# Patient Record
Sex: Female | Born: 1990 | Race: Black or African American | Hispanic: No | Marital: Single | Smoking: Former smoker
Health system: Southern US, Community
[De-identification: ages and names within clinical notes are randomized; demographics above are authoritative.]

## PROBLEM LIST (undated history)

## (undated) ENCOUNTER — Inpatient Hospital Stay (HOSPITAL_COMMUNITY): Payer: Self-pay

## (undated) ENCOUNTER — Emergency Department (HOSPITAL_COMMUNITY): Disposition: A | Discharge status: Home / Self Care | Payer: Self-pay

## (undated) DIAGNOSIS — F32A Depression, unspecified: Secondary | ICD-10-CM

## (undated) DIAGNOSIS — F329 Major depressive disorder, single episode, unspecified: Secondary | ICD-10-CM

## (undated) DIAGNOSIS — F99 Mental disorder, not otherwise specified: Secondary | ICD-10-CM

## (undated) DIAGNOSIS — F419 Anxiety disorder, unspecified: Secondary | ICD-10-CM

## (undated) DIAGNOSIS — O139 Gestational [pregnancy-induced] hypertension without significant proteinuria, unspecified trimester: Secondary | ICD-10-CM

## (undated) HISTORY — PX: MOUTH SURGERY: SHX715

## (undated) HISTORY — PX: TOOTH EXTRACTION: SUR596

## (undated) HISTORY — DX: Gestational (pregnancy-induced) hypertension without significant proteinuria, unspecified trimester: O13.9

---
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
## 2009-06-10 ENCOUNTER — Other Ambulatory Visit: Admission: RE | Admit: 2009-06-10 | Discharge: 2009-06-10 | Payer: Self-pay | Admitting: Obstetrics and Gynecology

## 2010-08-18 ENCOUNTER — Other Ambulatory Visit (HOSPITAL_COMMUNITY)
Admission: RE | Admit: 2010-08-18 | Discharge: 2010-08-18 | Disposition: A | Discharge status: Home / Self Care | Payer: Managed Care, Other (non HMO) | Source: Ambulatory Visit | Attending: Obstetrics and Gynecology | Admitting: Obstetrics and Gynecology

## 2010-08-18 ENCOUNTER — Other Ambulatory Visit: Payer: Self-pay | Admitting: Nurse Practitioner

## 2010-08-18 DIAGNOSIS — Z01419 Encounter for gynecological examination (general) (routine) without abnormal findings: Secondary | ICD-10-CM | POA: Insufficient documentation

## 2010-08-18 DIAGNOSIS — Z113 Encounter for screening for infections with a predominantly sexual mode of transmission: Secondary | ICD-10-CM | POA: Insufficient documentation

## 2011-05-10 ENCOUNTER — Encounter (HOSPITAL_COMMUNITY): Payer: Self-pay | Admitting: Emergency Medicine

## 2011-05-10 ENCOUNTER — Emergency Department (INDEPENDENT_AMBULATORY_CARE_PROVIDER_SITE_OTHER)
Admission: EM | Admit: 2011-05-10 | Discharge: 2011-05-10 | Disposition: A | Discharge status: Home / Self Care | Payer: Managed Care, Other (non HMO) | Source: Home / Self Care | Attending: Family Medicine | Admitting: Family Medicine

## 2011-05-10 ENCOUNTER — Emergency Department (INDEPENDENT_AMBULATORY_CARE_PROVIDER_SITE_OTHER): Payer: Managed Care, Other (non HMO)

## 2011-05-10 DIAGNOSIS — S239XXA Sprain of unspecified parts of thorax, initial encounter: Secondary | ICD-10-CM

## 2011-05-10 DIAGNOSIS — Z041 Encounter for examination and observation following transport accident: Secondary | ICD-10-CM

## 2011-05-10 DIAGNOSIS — S29012A Strain of muscle and tendon of back wall of thorax, initial encounter: Secondary | ICD-10-CM

## 2011-05-10 MED ORDER — DICLOFENAC POTASSIUM 50 MG PO TABS: 50.0000 mg | ORAL_TABLET | Freq: Three times a day (TID) | ORAL | Status: AC
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
## 2011-05-10 NOTE — ED Notes (Signed)
REPORT GIVEN TO AMY RN.

## 2011-05-10 NOTE — Discharge Instructions (Signed)
Use medication for back soreness as needed, heat to back for comfort, regular activity. Return if needed but expect soreness for 7-10 days.
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
## 2011-09-28 ENCOUNTER — Encounter: Payer: Managed Care, Other (non HMO) | Admitting: Obstetrics & Gynecology

## 2012-10-30 ENCOUNTER — Encounter (HOSPITAL_COMMUNITY): Payer: Self-pay

## 2012-10-30 ENCOUNTER — Inpatient Hospital Stay (HOSPITAL_COMMUNITY)
Admission: AD | Admit: 2012-10-30 | Discharge: 2012-10-30 | Disposition: A | Discharge status: Home / Self Care | Payer: Managed Care, Other (non HMO) | Source: Ambulatory Visit | Attending: Obstetrics & Gynecology | Admitting: Obstetrics & Gynecology

## 2012-10-30 ENCOUNTER — Inpatient Hospital Stay (HOSPITAL_COMMUNITY): Payer: Managed Care, Other (non HMO)

## 2012-10-30 DIAGNOSIS — O2390 Unspecified genitourinary tract infection in pregnancy, unspecified trimester: Secondary | ICD-10-CM | POA: Insufficient documentation

## 2012-10-30 DIAGNOSIS — O4690 Antepartum hemorrhage, unspecified, unspecified trimester: Secondary | ICD-10-CM

## 2012-10-30 DIAGNOSIS — N760 Acute vaginitis: Secondary | ICD-10-CM | POA: Insufficient documentation

## 2012-10-30 DIAGNOSIS — A499 Bacterial infection, unspecified: Secondary | ICD-10-CM | POA: Insufficient documentation

## 2012-10-30 DIAGNOSIS — O209 Hemorrhage in early pregnancy, unspecified: Secondary | ICD-10-CM

## 2012-10-30 DIAGNOSIS — B9689 Other specified bacterial agents as the cause of diseases classified elsewhere: Secondary | ICD-10-CM | POA: Insufficient documentation

## 2012-10-30 DIAGNOSIS — R109 Unspecified abdominal pain: Secondary | ICD-10-CM | POA: Insufficient documentation

## 2012-10-30 DIAGNOSIS — O210 Mild hyperemesis gravidarum: Secondary | ICD-10-CM | POA: Insufficient documentation

## 2012-10-30 LAB — URINALYSIS, ROUTINE W REFLEX MICROSCOPIC
Bilirubin Urine: NEGATIVE
Glucose, UA: NEGATIVE mg/dL
Hgb urine dipstick: NEGATIVE
Ketones, ur: 15 mg/dL — AB
Leukocytes, UA: NEGATIVE
Nitrite: NEGATIVE
Protein, ur: NEGATIVE mg/dL
Specific Gravity, Urine: 1.025 (ref 1.005–1.030)
Urobilinogen, UA: 1 mg/dL (ref 0.0–1.0)
pH: 6 (ref 5.0–8.0)

## 2012-10-30 LAB — CBC
HCT: 37.6 % (ref 36.0–46.0)
Hemoglobin: 13.1 g/dL (ref 12.0–15.0)
MCH: 29.4 pg (ref 26.0–34.0)
MCHC: 34.8 g/dL (ref 30.0–36.0)
MCV: 84.3 fL (ref 78.0–100.0)
Platelets: 236 10*3/uL (ref 150–400)
RBC: 4.46 MIL/uL (ref 3.87–5.11)
RDW: 12.3 % (ref 11.5–15.5)
WBC: 8.7 10*3/uL (ref 4.0–10.5)

## 2012-10-30 LAB — WET PREP, GENITAL
Trich, Wet Prep: NONE SEEN
Yeast Wet Prep HPF POC: NONE SEEN

## 2012-10-30 LAB — ABO/RH: ABO/RH(D): O POS

## 2012-10-30 LAB — POCT PREGNANCY, URINE: Preg Test, Ur: POSITIVE — AB

## 2012-10-30 LAB — HCG, QUANTITATIVE, PREGNANCY: hCG, Beta Chain, Quant, S: 38465 m[IU]/mL — ABNORMAL HIGH (ref <5)

## 2012-10-30 MED ORDER — METRONIDAZOLE 500 MG PO TABS: 500.0000 mg | ORAL_TABLET | Freq: Two times a day (BID) | ORAL | Status: DC

## 2012-10-30 MED ORDER — ONDANSETRON HCL 4 MG PO TABS: 4.0000 mg | ORAL_TABLET | Freq: Four times a day (QID) | ORAL | Status: DC

## 2012-10-30 MED ORDER — PROMETHAZINE HCL 25 MG PO TABS: 25.0000 mg | ORAL_TABLET | Freq: Four times a day (QID) | ORAL | Status: DC | PRN

## 2012-10-30 NOTE — MAU Note (Signed)
Patient states she has had 2 positive home pregnancy tests. Has had cramping all day, nausea but no vomiting and light pink spotting on tissue with wiping x 4 today.

## 2012-10-30 NOTE — MAU Note (Signed)
Pt states she took 2 home pregnancy tests that were positive. Pt is having some spotting. Pt states she is seeing blood when she wipes and is having some discharge

## 2012-10-30 NOTE — MAU Provider Note (Signed)
History     CSN: [id number]  Arrival date and time: 10/30/12 3086   First Provider Initiated Contact with Patient 10/30/12 1844      Chief Complaint  Patient presents with  . Possible Pregnancy  . Vaginal Discharge  . Nausea  . Abdominal Pain   HPI [name] is a 22 y.o. G1P0 at [redacted]w[redacted]d who presents to MAU today with complaint of lower abdominal pain and vaginal spotting. The patient states LMP 09/16/12. She started spotting yesterday, she is also having a thick, white-pink discharge without odor. She has had nausea without vomiting, diarrhea or constipation. She denies fever or UTI symptoms.    OB History   Grav Para Term Preterm Abortions TAB SAB Ect Mult Living   1               History reviewed. No pertinent past medical history.  Past Surgical History  Procedure Laterality Date  . Mouth surgery      Family History  Problem Relation Age of Onset  . Hypertension Mother     History  Substance Use Topics  . Smoking status: Current Some Day Smoker    Types: Cigarettes  . Smokeless tobacco: Never Used  . Alcohol Use: 7.2 oz/week    2 Cans of beer, 10 Shots of liquor per week    Allergies:  Allergies  Allergen Reactions  . Dust Mite Extract   . Pollen Extract     No prescriptions prior to admission    Review of Systems  Constitutional: Negative for fever and malaise/fatigue.  Gastrointestinal: Positive for nausea and abdominal pain. Negative for vomiting, diarrhea and constipation.  Genitourinary: Negative for dysuria, urgency and frequency.       + vaginal bleeding, discharge  Neurological: Positive for dizziness. Negative for loss of consciousness and weakness.   Physical Exam   Blood pressure 124/75, pulse 84, temperature 98.2 F (36.8 C), temperature source Oral, resp. rate 16, height 5\' 4"  (1.626 m), weight 162 lb (73.483 kg), last menstrual period 09/16/2012, SpO2 100.00%.  Physical Exam  Constitutional: She is oriented to person, place,  and time. She appears well-developed and well-nourished. No distress.  HENT:  Head: Normocephalic and atraumatic.  Cardiovascular: Normal rate.   Respiratory: Effort normal.  GI: Soft. She exhibits no distension and no mass. There is no tenderness. There is no rebound and no guarding.  Genitourinary: Uterus is enlarged (appropriate for GA) and tender (mild tenderness to palpation on bimanual exam). Cervix exhibits no motion tenderness, no discharge and no friability. Right adnexum displays no mass and no tenderness. Left adnexum displays no mass and no tenderness. There is bleeding (scant bleeding) around the vagina. Vaginal discharge (small amount of thin, pink discharge noted) found.  Neurological: She is alert and oriented to person, place, and time.  Skin: Skin is warm and dry. No erythema.  Psychiatric: She has a normal mood and affect.   Results for orders placed during the hospital encounter of 10/30/12 (from the past 24 hour(s))  URINALYSIS, ROUTINE W REFLEX MICROSCOPIC     Status: Abnormal   Collection Time    10/30/12  6:25 PM      Result Value [place]   Color, Urine YELLOW  YELLOW   APPearance CLEAR  CLEAR   Specific Gravity, Urine 1.025  1.005 - 1.030   pH 6.0  5.0 - 8.0   Glucose, UA NEGATIVE  NEGATIVE mg/dL   Hgb urine dipstick NEGATIVE  NEGATIVE   Bilirubin Urine NEGATIVE  NEGATIVE   Ketones, ur 15 (*) NEGATIVE mg/dL   Protein, ur NEGATIVE  NEGATIVE mg/dL   Urobilinogen, UA 1.0  0.0 - 1.0 mg/dL   Nitrite NEGATIVE  NEGATIVE   Leukocytes, UA NEGATIVE  NEGATIVE  POCT PREGNANCY, URINE     Status: Abnormal   Collection Time    10/30/12  6:33 PM      Result Value [place]   Preg Test, Ur POSITIVE (*) NEGATIVE  WET PREP, GENITAL     Status: Abnormal   Collection Time    10/30/12  6:58 PM      Result Value [place]   Yeast Wet Prep HPF POC NONE SEEN  NONE SEEN   Trich, Wet Prep NONE SEEN  NONE SEEN   Clue Cells Wet Prep HPF POC FEW (*) NONE SEEN   WBC, Wet Prep HPF POC FEW (*)  NONE SEEN  CBC     Status: None   Collection Time    10/30/12  7:05 PM      Result Value [place]   WBC 8.7  4.0 - 10.5 K/uL   RBC 4.46  3.87 - 5.11 MIL/uL   Hemoglobin 13.1  12.0 - 15.0 g/dL   HCT 81.1  91.4 - 78.2 %   MCV 84.3  78.0 - 100.0 fL   MCH 29.4  26.0 - 34.0 pg   MCHC 34.8  30.0 - 36.0 g/dL   RDW 95.6  21.3 - 08.6 %   Platelets 236  150 - 400 K/uL  ABO/RH     Status: None   Collection Time    10/30/12  7:05 PM      Result Value [place]   ABO/RH(D) O POS     US Ob Comp Less 14 Wks  10/30/2012   CLINICAL DATA:  Spotting, cramping.  EXAM: OBSTETRIC <14 WK ULTRASOUND  TECHNIQUE: Transabdominal ultrasound was performed for evaluation of the gestation as well as the maternal uterus and adnexal regions.  COMPARISON:  None.  FINDINGS: Intrauterine gestational sac: Visualized/normal in shape.  Yolk sac:  Present  Embryo:  Present  Cardiac Activity: Visualized.  Heart Rate: 121 bpm  MSD:   mm    w     d  CRL:   5.3  mm   6 w 3 d                  [place] EDC: 06/22/2013  Maternal uterus/adnexae: No subchorionic hemorrhage. Right ovary unremarkable. Left ovary not visualized. No adnexal masses. No free fluid.  IMPRESSION: 6 week 3 day intrauterine pregnancy with fetal heart rate 121 beats per min. No visible subchorionic hemorrhage.   Electronically Signed   By: Charlett Nose M.D.   On: 10/30/2012 19:29    MAU Course  Procedures None  MDM + UPT  UA, Wet prep, GC/Chlamydia, CBC, ABO/Rh, quant hCG and [place] today  Assessment and Plan  A: IUP at 6w 3d with cardiac activity Bacterial vaginosis Nausea in early pregnancy  P: Discharge home Rx for Flagyl, phenergan and Zofran sent to patient's pharmacy GC/Chlamydia pending Patient given pregnancy confirmation letter. Medicaid assistance information given Patient referred to [facility] for prenatal care Patient may return to MAU as needed or if her condition were to change or worsen  Freddi Starr, PA-C 10/30/2012, 7:50 PM

## 2012-10-31 LAB — GC/CHLAMYDIA PROBE AMP
CT Probe RNA: NEGATIVE
GC Probe RNA: NEGATIVE

## 2012-12-04 ENCOUNTER — Encounter: Payer: Managed Care, Other (non HMO) | Admitting: Family Medicine

## 2013-09-04 ENCOUNTER — Encounter (HOSPITAL_COMMUNITY): Payer: Self-pay | Admitting: *Deleted

## 2013-12-11 ENCOUNTER — Other Ambulatory Visit: Payer: Self-pay | Admitting: Endodontics

## 2013-12-22 ENCOUNTER — Encounter (HOSPITAL_COMMUNITY): Payer: Self-pay | Admitting: *Deleted

## 2014-07-17 ENCOUNTER — Inpatient Hospital Stay (HOSPITAL_COMMUNITY): Payer: Managed Care, Other (non HMO)

## 2014-07-17 ENCOUNTER — Encounter (HOSPITAL_COMMUNITY): Payer: Self-pay | Admitting: *Deleted

## 2014-07-17 ENCOUNTER — Inpatient Hospital Stay (HOSPITAL_COMMUNITY)
Admission: EM | Admit: 2014-07-17 | Discharge: 2014-07-17 | Disposition: A | Discharge status: Home / Self Care | Payer: Managed Care, Other (non HMO) | Source: Ambulatory Visit | Attending: Family Medicine | Admitting: Family Medicine

## 2014-07-17 DIAGNOSIS — A499 Bacterial infection, unspecified: Secondary | ICD-10-CM

## 2014-07-17 DIAGNOSIS — O99331 Smoking (tobacco) complicating pregnancy, first trimester: Secondary | ICD-10-CM | POA: Diagnosis not present

## 2014-07-17 DIAGNOSIS — Z3A01 Less than 8 weeks gestation of pregnancy: Secondary | ICD-10-CM | POA: Diagnosis not present

## 2014-07-17 DIAGNOSIS — O23591 Infection of other part of genital tract in pregnancy, first trimester: Secondary | ICD-10-CM | POA: Insufficient documentation

## 2014-07-17 DIAGNOSIS — R102 Pelvic and perineal pain: Secondary | ICD-10-CM | POA: Diagnosis present

## 2014-07-17 DIAGNOSIS — B9689 Other specified bacterial agents as the cause of diseases classified elsewhere: Secondary | ICD-10-CM | POA: Insufficient documentation

## 2014-07-17 DIAGNOSIS — N760 Acute vaginitis: Secondary | ICD-10-CM | POA: Insufficient documentation

## 2014-07-17 DIAGNOSIS — F17210 Nicotine dependence, cigarettes, uncomplicated: Secondary | ICD-10-CM | POA: Insufficient documentation

## 2014-07-17 DIAGNOSIS — O26899 Other specified pregnancy related conditions, unspecified trimester: Secondary | ICD-10-CM

## 2014-07-17 DIAGNOSIS — Z8249 Family history of ischemic heart disease and other diseases of the circulatory system: Secondary | ICD-10-CM | POA: Diagnosis not present

## 2014-07-17 DIAGNOSIS — R109 Unspecified abdominal pain: Secondary | ICD-10-CM

## 2014-07-17 LAB — URINALYSIS, ROUTINE W REFLEX MICROSCOPIC
Bilirubin Urine: NEGATIVE
Glucose, UA: NEGATIVE mg/dL
Hgb urine dipstick: NEGATIVE
Ketones, ur: NEGATIVE mg/dL
Leukocytes, UA: NEGATIVE
Nitrite: NEGATIVE
Protein, ur: NEGATIVE mg/dL
Specific Gravity, Urine: 1.015 (ref 1.005–1.030)
Urobilinogen, UA: 0.2 mg/dL (ref 0.0–1.0)
pH: 8.5 — ABNORMAL HIGH (ref 5.0–8.0)

## 2014-07-17 LAB — WET PREP, GENITAL
Trich, Wet Prep: NONE SEEN
Yeast Wet Prep HPF POC: NONE SEEN

## 2014-07-17 LAB — CBC
HCT: 41 % (ref 36.0–46.0)
Hemoglobin: 14.3 g/dL (ref 12.0–15.0)
MCH: 30 pg (ref 26.0–34.0)
MCHC: 34.9 g/dL (ref 30.0–36.0)
MCV: 86.1 fL (ref 78.0–100.0)
Platelets: 272 10*3/uL (ref 150–400)
RBC: 4.76 MIL/uL (ref 3.87–5.11)
RDW: 12.6 % (ref 11.5–15.5)
WBC: 5.9 10*3/uL (ref 4.0–10.5)

## 2014-07-17 LAB — POCT PREGNANCY, URINE: Preg Test, Ur: POSITIVE — AB

## 2014-07-17 LAB — HCG, QUANTITATIVE, PREGNANCY: hCG, Beta Chain, Quant, S: 10480 m[IU]/mL — ABNORMAL HIGH (ref <5)

## 2014-07-17 MED ORDER — METRONIDAZOLE 500 MG PO TABS: 500.0000 mg | ORAL_TABLET | Freq: Two times a day (BID) | ORAL | Status: DC

## 2014-07-17 NOTE — MAU Provider Note (Signed)
History     CSN: [id number]  Arrival date and time: 07/17/14 1445   First Provider Initiated Contact with Patient 07/17/14 1616      Chief Complaint  Patient presents with  . Pelvic Pain   HPI  [name] is a 24 y.o. G2P0010 at 6389w5d here with report of lower right sided pelvic pain that started two days ago.  Pain is described as sharp.  Pain has been associated with clear white discharge.  Denies vaginal bleeding or abnormal vaginal discharge.    History reviewed. No pertinent past medical history.  Past Surgical History  Procedure Laterality Date  . Mouth surgery      Family History  Problem Relation Age of Onset  . Hypertension Mother     History  Substance Use Topics  . Smoking status: Current Some Day Smoker    Types: Cigarettes  . Smokeless tobacco: Never Used  . Alcohol Use: 7.2 oz/week    2 Cans of beer, 10 Shots of liquor per week    Allergies:  Allergies  Allergen Reactions  . Dust Mite Extract   . Pollen Extract     Prescriptions prior to admission  Medication Sig Dispense Refill Last Dose  . ibuprofen (ADVIL,MOTRIN) 200 MG tablet Take 200 mg by mouth every 6 (six) hours as needed (tooth pain).   07/17/2014 at Unknown time  . Prenatal Vit-Fe Fumarate-FA (PRENATAL MULTIVITAMIN) TABS tablet Take 1 tablet by mouth daily at 12 noon.   07/17/2014 at Unknown time  . bismuth subsalicylate (PEPTO BISMOL) 262 MG chewable tablet Chew 524 mg by mouth as needed for indigestion.   Not Taking at Unknown time  . metroNIDAZOLE (FLAGYL) 500 MG tablet Take 1 tablet (500 mg total) by mouth 2 (two) times daily. (Patient not taking: Reported on 07/17/2014) 14 tablet 0   . ondansetron (ZOFRAN) 4 MG tablet Take 1 tablet (4 mg total) by mouth every 6 (six) hours. (Patient not taking: Reported on 07/17/2014) 12 tablet 0 Not Taking at Unknown time  . promethazine (PHENERGAN) 25 MG tablet Take 1 tablet (25 mg total) by mouth every 6 (six) hours as needed for nausea. (Patient  not taking: Reported on 07/17/2014) 30 tablet 0 Not Taking at Unknown time    Review of Systems  Constitutional: Negative for fever.  Gastrointestinal: Positive for nausea and abdominal pain. Negative for vomiting and diarrhea.  Genitourinary: Negative for dysuria, urgency and frequency.  All other systems reviewed and are negative.  Physical Exam   Blood pressure 131/85, pulse 77, temperature 98.5 F (36.9 C), temperature source Oral, resp. rate 18, last menstrual period 05/31/2014, unknown if currently breastfeeding.  Physical Exam  Constitutional: She is oriented to person, place, and time. She appears well-developed and well-nourished. No distress.  HENT:  Head: Normocephalic.  Neck: Normal [place] of motion. Neck supple.  Cardiovascular: Normal rate, regular rhythm and normal heart sounds.  Exam reveals no gallop and no friction rub.   No murmur heard. Respiratory: Effort normal and breath sounds normal. No respiratory distress.  GI: She exhibits no mass. There is no tenderness. There is no rebound, no guarding and no CVA tenderness.  Genitourinary: Uterus is enlarged. Cervix exhibits no motion tenderness and no discharge. Vaginal discharge (white, creamy) found.  Musculoskeletal: Normal [place] of motion.  Neurological: She is alert and oriented to person, place, and time.  Skin: Skin is warm and dry.  Psychiatric: She has a normal mood and affect.    MAU Course  Procedures  Results for orders placed or performed during the hospital encounter of 07/17/14 (from the past 24 hour(s))  Urinalysis, Routine w reflex microscopic (not at [facility])     Status: Abnormal   Collection Time: 07/17/14  3:15 PM  Result Value Ref [place]   Color, Urine YELLOW YELLOW   APPearance CLEAR CLEAR   Specific Gravity, Urine 1.015 1.005 - 1.030   pH 8.5 (H) 5.0 - 8.0   Glucose, UA NEGATIVE NEGATIVE mg/dL   Hgb urine dipstick NEGATIVE NEGATIVE   Bilirubin Urine NEGATIVE NEGATIVE   Ketones, ur NEGATIVE  NEGATIVE mg/dL   Protein, ur NEGATIVE NEGATIVE mg/dL   Urobilinogen, UA 0.2 0.0 - 1.0 mg/dL   Nitrite NEGATIVE NEGATIVE   Leukocytes, UA NEGATIVE NEGATIVE  Pregnancy, urine POC     Status: Abnormal   Collection Time: 07/17/14  3:26 PM  Result Value Ref [place]   Preg Test, Ur POSITIVE (A) NEGATIVE  CBC     Status: None   Collection Time: 07/17/14  4:51 PM  Result Value Ref [place]   WBC 5.9 4.0 - 10.5 K/uL   RBC 4.76 3.87 - 5.11 MIL/uL   Hemoglobin 14.3 12.0 - 15.0 g/dL   HCT 11.9 14.7 - 82.9 %   MCV 86.1 78.0 - 100.0 fL   MCH 30.0 26.0 - 34.0 pg   MCHC 34.9 30.0 - 36.0 g/dL   RDW 56.2 13.0 - 86.5 %   Platelets 272 150 - 400 K/uL   Ultrasound: IMPRESSION: Single intrauterine gestational sac noted, with a mean sac diameter of 6.6 mm, corresponding to a gestational age of [redacted] weeks 2 days. This does not match the gestational age of [redacted] weeks 5 days by LMP, though it remains too early to determine an estimated date of delivery. A yolk sac is seen. No embryo is yet visualized, within normal limits.  Assessment and Plan  [name] is a 24 y.o. G2P0010 at [redacted]w[redacted]d pregnancy Bacterial Vaginosis  Plan: Discharge to home RX Flagyl 500 mg BID x 7 days Outpatient ultrasound due to discrepancy in patient dates/patient concern Given list of prenatal care providers  Elenora Fender [facility] N 07/17/2014, 4:20 PM

## 2014-07-17 NOTE — MAU Note (Signed)
Pos HPT 2 days ago, pt wanted to be sure of results.  Pt C/O intermittent lower abd pain & pain around umbilicus, denies bleeding.

## 2014-07-18 LAB — HIV ANTIBODY (ROUTINE TESTING W REFLEX): HIV Screen 4th Generation wRfx: NONREACTIVE

## 2014-07-21 LAB — GC/CHLAMYDIA PROBE AMP (~~LOC~~) NOT AT ARMC
Chlamydia: NEGATIVE
Neisseria Gonorrhea: NEGATIVE

## 2014-07-23 ENCOUNTER — Inpatient Hospital Stay (HOSPITAL_COMMUNITY): Payer: Managed Care, Other (non HMO)

## 2014-07-23 ENCOUNTER — Inpatient Hospital Stay (HOSPITAL_COMMUNITY)
Admission: AD | Admit: 2014-07-23 | Discharge: 2014-07-23 | Disposition: A | Discharge status: Home / Self Care | Payer: Managed Care, Other (non HMO) | Source: Ambulatory Visit | Attending: Obstetrics & Gynecology | Admitting: Obstetrics & Gynecology

## 2014-07-23 ENCOUNTER — Encounter (HOSPITAL_COMMUNITY): Payer: Self-pay | Admitting: *Deleted

## 2014-07-23 DIAGNOSIS — O209 Hemorrhage in early pregnancy, unspecified: Secondary | ICD-10-CM

## 2014-07-23 DIAGNOSIS — O4691 Antepartum hemorrhage, unspecified, first trimester: Secondary | ICD-10-CM | POA: Diagnosis not present

## 2014-07-23 DIAGNOSIS — Z3A01 Less than 8 weeks gestation of pregnancy: Secondary | ICD-10-CM | POA: Insufficient documentation

## 2014-07-23 DIAGNOSIS — Z87891 Personal history of nicotine dependence: Secondary | ICD-10-CM | POA: Insufficient documentation

## 2014-07-23 LAB — HCG, QUANTITATIVE, PREGNANCY: hCG, Beta Chain, Quant, S: 30448 m[IU]/mL — ABNORMAL HIGH (ref <5)

## 2014-07-23 MED ORDER — METRONIDAZOLE 0.75 % VA GEL: 1.0000 | Freq: Every day | VAGINAL | Status: DC

## 2014-07-23 NOTE — MAU Provider Note (Signed)
Chief Complaint: Vaginal Bleeding   First Provider Initiated Contact with Patient 07/23/14 1235     SUBJECTIVE HPI: Carol Nichols is a 24 y.o. G2P0010 at [redacted]w[redacted]d by LMP who presents to Maternity Admissions reporting pink discharge this morning. Seen in maternity admissions 07/17/2014 abdominal pain. 5.[redacted] week Gestational sac with yolk sac seen. Diagnosed with BV. Currently undergoing treatment with PO Flagyl, and unable to keep down because it causes nausea and vomiting.  No pain today. Last intercourse 1 week ago. O+.  History reviewed. No pertinent past medical history. OB History  Gravida Para Term Preterm AB SAB TAB Ectopic Multiple Living  # Outcome Date GA Lbr Len/2nd Weight Sex Delivery Anes PTL Lv  2 Current           1 TAB              Comments: System Generated. Please review and update pregnancy details.     Past Surgical History  Procedure Laterality Date  . Mouth surgery     History   Social History  . Marital Status: Single    Spouse Name: N/A  . Number of Children: N/A  . Years of Education: N/A   Occupational History  . Not on file.   Social History Main Topics  . Smoking status: Former Smoker    Types: Cigarettes    Quit date: 07/15/2014  . Smokeless tobacco: Never Used  . Alcohol Use: No     Comment: 07/15/14 quit while pregnant  . Drug Use: No     Comment: 07/15/14- quit   . Sexual Activity: Yes    Birth Control/ Protection: None   Other Topics Concern  . Not on file   Social History Narrative   No current facility-administered medications on file prior to encounter.   Current Outpatient Prescriptions on File Prior to Encounter  Medication Sig Dispense Refill  . metroNIDAZOLE (FLAGYL) 500 MG tablet Take 1 tablet (500 mg total) by mouth 2 (two) times daily. 14 tablet 0  . Prenatal Vit-Fe Fumarate-FA (PRENATAL MULTIVITAMIN) TABS tablet Take 1 tablet by mouth daily at 12 noon.     Allergies  Allergen Reactions  . Dust Mite Extract    . Pollen Extract     Review of Systems  Constitutional: Negative for fever and chills.  Gastrointestinal: Negative for abdominal pain.  Genitourinary: Negative for hematuria.       Positive for vaginal bleeding, vaginal discharge. Negative for passage of clots or tissue.  Skin: Negative for itching.    OBJECTIVE Blood pressure 132/88, pulse 80, temperature 98.2 F (36.8 C), temperature source Oral, resp. rate 18, height 5' 4.17" (1.63 m), weight 159 lb 2 oz (72.179 kg), last menstrual period 05/31/2014. GENERAL: Well-developed, well-nourished female in no acute distress. Anxious. HEART: normal rate RESP: normal effort GI: Abdomen soft, non-tender.  MS: Nontender, no edema NEURO: Alert and oriented SPECULUM EXAM: Declined due to recent exam.  LAB RESULTS Results for orders placed or performed during the hospital encounter of 07/23/14 (from the past 24 hour(s))  hCG, quantitative, pregnancy     Status: Abnormal   Collection Time: 07/23/14 12:20 PM  Result Value Ref [place]   hCG, Beta Chain, Quant, S 30448 (H) <5 mIU/mL    IMAGING US Ob Comp Less 14 Wks  07/17/2014   CLINICAL DATA:  Subacute onset of right pelvic pain. Initial encounter.  EXAM: OBSTETRIC <14 WK [place] AND TRANSVAGINAL  OB US  TECHNIQUE: Both transabdominal and transvaginal ultrasound examinations were performed for complete evaluation of the gestation as well as the maternal uterus, adnexal regions, and pelvic cul-de-sac. Transvaginal technique was performed to assess early pregnancy.  COMPARISON:  Pelvic ultrasound performed 10/30/2012  FINDINGS: Intrauterine gestational sac: Visualized/normal in shape.  Yolk sac:  Yes  Embryo:  No  Cardiac Activity: N/A  MSD: 6.6  mm   5 w   2  d  Maternal uterus/adnexae: No subchorionic hemorrhage is noted. The uterus is otherwise unremarkable in appearance.  The ovaries are within normal limits. The right ovary measures 4.1 x 2.1 x 2.3 cm, while the left ovary measures 2.5 x 1.7 x 1.5  cm. No suspicious adnexal masses are seen; there is no evidence for ovarian torsion.  Trace free fluid is seen within the pelvic cul-de-sac.  IMPRESSION: Single intrauterine gestational sac noted, with a mean sac diameter of 6.6 mm, corresponding to a gestational age of [redacted] weeks 2 days. This does not match the gestational age of [redacted] weeks 5 days by LMP, though it remains too early to determine an estimated date of delivery. A yolk sac is seen. No embryo is yet visualized, within normal limits.   Electronically Signed   By: Roanna Raider M.D.   On: 07/17/2014 17:30   US Ob Transvaginal  07/23/2014   CLINICAL DATA:  Pregnant, bleeding/spotting  EXAM: TRANSVAGINAL OB ULTRASOUND  TECHNIQUE: Transvaginal ultrasound was performed for complete evaluation of the gestation as well as the maternal uterus, adnexal regions, and pelvic cul-de-sac.  COMPARISON:  07/17/2014  FINDINGS: Intrauterine gestational sac: Visualized/normal in shape.  Yolk sac:  Present  Embryo:  Not visualized  MSD: 12.1  mm   6 w   0  d  Maternal uterus/adnexae: No subchronic hemorrhage.  Right ovary is notable for a corpus luteal cyst.  Left ovary is within normal limits.  No free fluid.  IMPRESSION: Single intrauterine gestational sac with yolk sac, measuring 6 weeks 0 days by mean sac diameter.  No fetal pole is visualized.  Consider follow-up pelvic ultrasound in 14 days to confirm viability as clinically warranted.   Electronically Signed   By: Charline Bills M.D.   On: 07/23/2014 14:02   US Ob Transvaginal  07/17/2014   CLINICAL DATA:  Subacute onset of right pelvic pain. Initial encounter.  EXAM: OBSTETRIC <14 WK [place] AND TRANSVAGINAL OB US  TECHNIQUE: Both transabdominal and transvaginal ultrasound examinations were performed for complete evaluation of the gestation as well as the maternal uterus, adnexal regions, and pelvic cul-de-sac. Transvaginal technique was performed to assess early pregnancy.  COMPARISON:  Pelvic ultrasound performed  10/30/2012  FINDINGS: Intrauterine gestational sac: Visualized/normal in shape.  Yolk sac:  Yes  Embryo:  No  Cardiac Activity: N/A  MSD: 6.6  mm   5 w   2  d  Maternal uterus/adnexae: No subchorionic hemorrhage is noted. The uterus is otherwise unremarkable in appearance.  The ovaries are within normal limits. The right ovary measures 4.1 x 2.1 x 2.3 cm, while the left ovary measures 2.5 x 1.7 x 1.5 cm. No suspicious adnexal masses are seen; there is no evidence for ovarian torsion.  Trace free fluid is seen within the pelvic cul-de-sac.  IMPRESSION: Single intrauterine gestational sac noted, with a mean sac diameter of 6.6 mm, corresponding to a gestational age of [redacted] weeks 2 days. This does not match the gestational age of [redacted] weeks 5 days by LMP, though it  remains too early to determine an estimated date of delivery. A yolk sac is seen. No embryo is yet visualized, within normal limits.   Electronically Signed   By: Roanna RaiderJeffery  Chang M.D.   On: 07/17/2014 17:30    MAU COURSE Ultrasound, Quant.  Fetal pole still not seen, but still too soon to diagnose failed pregnancy. Abnormal rise in Quant. Explained to patient that findings are concerning for a failing pregnancy, but not yet diagnostic.  ASSESSMENT 1. Vaginal bleeding in pregnancy, first trimester    PLAN Discharge home in stable condition. SAB precautions.     Follow-up Information    [facility] 1 week.   Specialty:  Radiology   Why:  For follow-up ultrasound   Contact information:   [address] [id number] [place] [id number] [phone]      [facility] [place] [facility].   Why:  As needed in emergencies   Contact information:   [address] [id number] [place] [id number] [phone]       Medication List    STOP taking these medications        metroNIDAZOLE 500 MG tablet   Commonly known as:  FLAGYL      TAKE these medications        metroNIDAZOLE 0.75 % vaginal gel  Commonly known as:  METROGEL  Place 1 Applicatorful vaginally at bedtime. Apply one applicatorful to vagina at bedtime for 5 days     prenatal multivitamin Tabs tablet  Take 1 tablet by mouth daily at 12 noon.         [place] Nandi Tonnesen, CNM 07/23/2014  2:50 PM

## 2014-07-23 NOTE — MAU Note (Signed)
This morning had pink discharged. Taking med. for BV and prenatal victims. Just concerned about pink discharge. 2nd pregnancy, 1 abortion

## 2014-07-23 NOTE — Progress Notes (Signed)
Noticed pink, thick, non-odorous discharge this morning. Last had intercourse a week ago. Currently being treated for BV. Denies pain.

## 2014-07-23 NOTE — Discharge Instructions (Signed)
Vaginal Bleeding During Pregnancy, First Trimester  A small amount of bleeding (spotting) from the vagina is relatively common in early pregnancy. It usually stops on its own. Various things may cause bleeding or spotting in early pregnancy. Some bleeding may be related to the pregnancy, and some may not. In most cases, the bleeding is normal and is not a problem. However, bleeding can also be a sign of something serious. Be sure to tell your health care provider about any vaginal bleeding right away.  Some possible causes of vaginal bleeding during the first trimester include:  · Infection or inflammation of the cervix.  · Growths (polyps) on the cervix.  · Miscarriage or threatened miscarriage.  · Pregnancy tissue has developed outside of the uterus and in a fallopian tube (tubal pregnancy).  · Tiny cysts have developed in the uterus instead of pregnancy tissue (molar pregnancy).  HOME CARE INSTRUCTIONS   Watch your condition for any changes. The following actions may help to lessen any discomfort you are feeling:  · Follow your health care provider's instructions for limiting your activity. If your health care provider orders bed rest, you may need to stay in bed and only get up to use the bathroom. However, your health care provider may allow you to continue light activity.  · If needed, make plans for someone to help with your regular activities and responsibilities while you are on bed rest.  · Keep track of the number of pads you use each day, how often you change pads, and how soaked (saturated) they are. Write this down.  · Do not use tampons. Do not douche.  · Do not have sexual intercourse or orgasms until approved by your health care provider.  · If you pass any tissue from your vagina, save the tissue so you can show it to your health care provider.  · Only take over-the-counter or prescription medicines as directed by your health care provider.  · Do not take aspirin because it can make you  bleed.  · Keep all follow-up appointments as directed by your health care provider.  SEEK MEDICAL CARE IF:  · You have any vaginal bleeding during any part of your pregnancy.  · You have cramps or labor pains.  · You have a fever, not controlled by medicine.  SEEK IMMEDIATE MEDICAL CARE IF:   · You have severe cramps in your back or belly (abdomen).  · You pass large clots or tissue from your vagina.  · Your bleeding increases.  · You feel light-headed or weak, or you have fainting episodes.  · You have chills.  · You are leaking fluid or have a gush of fluid from your vagina.  · You pass out while having a bowel movement.  MAKE SURE YOU:  · Understand these instructions.  · Will watch your condition.  · Will get help right away if you are not doing well or get worse.  Document Released: 11/16/2004 Document Revised: 02/11/2013 Document Reviewed: 10/14/2012  ExitCare® Patient Information ©2015 [employer]. This information is not intended to replace advice given to you by your health care provider. Make sure you discuss any questions you have with your health care provider.

## 2014-07-23 NOTE — MAU Note (Signed)
Urine in lab 

## 2014-07-30 ENCOUNTER — Inpatient Hospital Stay (HOSPITAL_COMMUNITY)
Admission: AD | Admit: 2014-07-30 | Discharge: 2014-07-30 | Disposition: A | Discharge status: Home / Self Care | Payer: Managed Care, Other (non HMO) | Source: Ambulatory Visit | Attending: Obstetrics & Gynecology | Admitting: Obstetrics & Gynecology

## 2014-07-30 ENCOUNTER — Inpatient Hospital Stay (HOSPITAL_COMMUNITY): Payer: Managed Care, Other (non HMO)

## 2014-07-30 ENCOUNTER — Encounter (HOSPITAL_COMMUNITY): Payer: Self-pay | Admitting: *Deleted

## 2014-07-30 DIAGNOSIS — K5900 Constipation, unspecified: Secondary | ICD-10-CM | POA: Insufficient documentation

## 2014-07-30 DIAGNOSIS — R102 Pelvic and perineal pain: Secondary | ICD-10-CM | POA: Insufficient documentation

## 2014-07-30 DIAGNOSIS — O9989 Other specified diseases and conditions complicating pregnancy, childbirth and the puerperium: Secondary | ICD-10-CM | POA: Insufficient documentation

## 2014-07-30 DIAGNOSIS — Z3A01 Less than 8 weeks gestation of pregnancy: Secondary | ICD-10-CM | POA: Diagnosis not present

## 2014-07-30 DIAGNOSIS — O359XX1 Maternal care for (suspected) fetal abnormality and damage, unspecified, fetus 1: Secondary | ICD-10-CM

## 2014-07-30 DIAGNOSIS — R799 Abnormal finding of blood chemistry, unspecified: Secondary | ICD-10-CM

## 2014-07-30 DIAGNOSIS — Z87891 Personal history of nicotine dependence: Secondary | ICD-10-CM | POA: Insufficient documentation

## 2014-07-30 DIAGNOSIS — R1030 Lower abdominal pain, unspecified: Secondary | ICD-10-CM | POA: Diagnosis present

## 2014-07-30 DIAGNOSIS — O26891 Other specified pregnancy related conditions, first trimester: Secondary | ICD-10-CM

## 2014-07-30 DIAGNOSIS — O3680X1 Pregnancy with inconclusive fetal viability, fetus 1: Secondary | ICD-10-CM

## 2014-07-30 DIAGNOSIS — IMO0002 Reserved for concepts with insufficient information to code with codable children: Secondary | ICD-10-CM

## 2014-07-30 LAB — URINALYSIS, ROUTINE W REFLEX MICROSCOPIC
Bilirubin Urine: NEGATIVE
Glucose, UA: NEGATIVE mg/dL
Hgb urine dipstick: NEGATIVE
Ketones, ur: NEGATIVE mg/dL
Leukocytes, UA: NEGATIVE
Nitrite: NEGATIVE
Protein, ur: NEGATIVE mg/dL
Specific Gravity, Urine: 1.02 (ref 1.005–1.030)
Urobilinogen, UA: 0.2 mg/dL (ref 0.0–1.0)
pH: 7 (ref 5.0–8.0)

## 2014-07-30 LAB — CBC
HCT: 39.8 % (ref 36.0–46.0)
Hemoglobin: 13.9 g/dL (ref 12.0–15.0)
MCH: 30.3 pg (ref 26.0–34.0)
MCHC: 34.9 g/dL (ref 30.0–36.0)
MCV: 86.7 fL (ref 78.0–100.0)
Platelets: 245 10*3/uL (ref 150–400)
RBC: 4.59 MIL/uL (ref 3.87–5.11)
RDW: 12.7 % (ref 11.5–15.5)
WBC: 7 10*3/uL (ref 4.0–10.5)

## 2014-07-30 LAB — HCG, QUANTITATIVE, PREGNANCY: hCG, Beta Chain, Quant, S: 35674 m[IU]/mL — ABNORMAL HIGH (ref <5)

## 2014-07-30 MED ORDER — TRAMADOL HCL 50 MG PO TABS
100.0000 mg | ORAL_TABLET | Freq: Once | ORAL | Status: AC
  Administered 2014-07-30: 100 mg via ORAL
  Filled 2014-07-30: qty 2

## 2014-07-30 MED ORDER — HYDROCODONE-ACETAMINOPHEN 5-325 MG PO TABS: 1.0000 | ORAL_TABLET | ORAL | Status: DC | PRN

## 2014-07-30 NOTE — Discharge Instructions (Signed)
First Trimester of Pregnancy The first trimester of pregnancy is from week 1 until the end of week 12 (months 1 through 3). A week after a sperm fertilizes an egg, the egg will implant on the wall of the uterus. This embryo will begin to develop into a baby. Genes from you and your partner are forming the baby. The female genes determine whether the baby is a boy or a girl. At 6-8 weeks, the eyes and face are formed, and the heartbeat can be seen on ultrasound. At the end of 12 weeks, all the baby's organs are formed.  Now that you are pregnant, you will want to do everything you can to have a healthy baby. Two of the most important things are to get good prenatal care and to follow your health care provider's instructions. Prenatal care is all the medical care you receive before the baby's birth. This care will help prevent, find, and treat any problems during the pregnancy and childbirth. BODY CHANGES Your body goes through many changes during pregnancy. The changes vary from woman to woman.   You may gain or lose a couple of pounds at first.  You may feel sick to your stomach (nauseous) and throw up (vomit). If the vomiting is uncontrollable, call your health care provider.  You may tire easily.  You may develop headaches that can be relieved by medicines approved by your health care provider.  You may urinate more often. Painful urination may mean you have a bladder infection.  You may develop heartburn as a result of your pregnancy.  You may develop constipation because certain hormones are causing the muscles that push waste through your intestines to slow down.  You may develop hemorrhoids or swollen, bulging veins (varicose veins).  Your breasts may begin to grow larger and become tender. Your nipples may stick out more, and the tissue that surrounds them (areola) may become darker.  Your gums may bleed and may be sensitive to brushing and flossing.  Dark spots or blotches (chloasma,  mask of pregnancy) may develop on your face. This will likely fade after the baby is born.  Your menstrual periods will stop.  You may have a loss of appetite.  You may develop cravings for certain kinds of food.  You may have changes in your emotions from day to day, such as being excited to be pregnant or being concerned that something may go wrong with the pregnancy and baby.  You may have more vivid and strange dreams.  You may have changes in your hair. These can include thickening of your hair, rapid growth, and changes in texture. Some women also have hair loss during or after pregnancy, or hair that feels dry or thin. Your hair will most likely return to normal after your baby is born. WHAT TO EXPECT AT YOUR PRENATAL VISITS During a routine prenatal visit:  You will be weighed to make sure you and the baby are growing normally.  Your blood pressure will be taken.  Your abdomen will be measured to track your baby's growth.  The fetal heartbeat will be listened to starting around week 10 or 12 of your pregnancy.  Test results from any previous visits will be discussed. Your health care provider may ask you:  How you are feeling.  If you are feeling the baby move.  If you have had any abnormal symptoms, such as leaking fluid, bleeding, severe headaches, or abdominal cramping.  If you have any questions. Other tests   that may be performed during your first trimester include:  Blood tests to find your blood type and to check for the presence of any previous infections. They will also be used to check for low iron levels (anemia) and Rh antibodies. Later in the pregnancy, blood tests for diabetes will be done along with other tests if problems develop.  Urine tests to check for infections, diabetes, or protein in the urine.  An ultrasound to confirm the proper growth and development of the baby.  An amniocentesis to check for possible genetic problems.  Fetal screens for  spina bifida and Down syndrome.  You may need other tests to make sure you and the baby are doing well. HOME CARE INSTRUCTIONS  Medicines  Follow your health care provider's instructions regarding medicine use. Specific medicines may be either safe or unsafe to take during pregnancy.  Take your prenatal vitamins as directed.  If you develop constipation, try taking a stool softener if your health care provider approves. Diet  Eat regular, well-balanced meals. Choose a variety of foods, such as meat or vegetable-based protein, fish, milk and low-fat dairy products, vegetables, fruits, and whole grain breads and cereals. Your health care provider will help you determine the amount of weight gain that is right for you.  Avoid raw meat and uncooked cheese. These carry germs that can cause birth defects in the baby.  Eating four or five small meals rather than three large meals a day may help relieve nausea and vomiting. If you start to feel nauseous, eating a few soda crackers can be helpful. Drinking liquids between meals instead of during meals also seems to help nausea and vomiting.  If you develop constipation, eat more high-fiber foods, such as fresh vegetables or fruit and whole grains. Drink enough fluids to keep your urine clear or pale yellow. Activity and Exercise  Exercise only as directed by your health care provider. Exercising will help you:  Control your weight.  Stay in shape.  Be prepared for labor and delivery.  Experiencing pain or cramping in the lower abdomen or low back is a good sign that you should stop exercising. Check with your health care provider before continuing normal exercises.  Try to avoid standing for long periods of time. Move your legs often if you must stand in one place for a long time.  Avoid heavy lifting.  Wear low-heeled shoes, and practice good posture.  You may continue to have sex unless your health care provider directs you  otherwise. Relief of Pain or Discomfort  Wear a good support bra for breast tenderness.   Take warm sitz baths to soothe any pain or discomfort caused by hemorrhoids. Use hemorrhoid cream if your health care provider approves.   Rest with your legs elevated if you have leg cramps or low back pain.  If you develop varicose veins in your legs, wear support hose. Elevate your feet for 15 minutes, 3-4 times a day. Limit salt in your diet. Prenatal Care  Schedule your prenatal visits by the twelfth week of pregnancy. They are usually scheduled monthly at first, then more often in the last 2 months before delivery.  Write down your questions. Take them to your prenatal visits.  Keep all your prenatal visits as directed by your health care provider. Safety  Wear your seat belt at all times when driving.  Make a list of emergency phone numbers, including numbers for family, friends, the hospital, and police and fire departments. General Tips    Ask your health care provider for a referral to a local prenatal education class. Begin classes no later than at the beginning of month 6 of your pregnancy.  Ask for help if you have counseling or nutritional needs during pregnancy. Your health care provider can offer advice or refer you to specialists for help with various needs.  Do not use hot tubs, steam rooms, or saunas.  Do not douche or use tampons or scented sanitary pads.  Do not cross your legs for long periods of time.  Avoid cat litter boxes and soil used by cats. These carry germs that can cause birth defects in the baby and possibly loss of the fetus by miscarriage or stillbirth.  Avoid all smoking, herbs, alcohol, and medicines not prescribed by your health care provider. Chemicals in these affect the formation and growth of the baby.  Schedule a dentist appointment. At home, brush your teeth with a soft toothbrush and be gentle when you floss. SEEK MEDICAL CARE IF:   You have  dizziness.  You have mild pelvic cramps, pelvic pressure, or nagging pain in the abdominal area.  You have persistent nausea, vomiting, or diarrhea.  You have a bad smelling vaginal discharge.  You have pain with urination.  You notice increased swelling in your face, hands, legs, or ankles. SEEK IMMEDIATE MEDICAL CARE IF:   You have a fever.  You are leaking fluid from your vagina.  You have spotting or bleeding from your vagina.  You have severe abdominal cramping or pain.  You have rapid weight gain or loss.  You vomit blood or material that looks like coffee grounds.  You are exposed to German measles and have never had them.  You are exposed to fifth disease or chickenpox.  You develop a severe headache.  You have shortness of breath.  You have any kind of trauma, such as from a fall or a car accident. Document Released: 01/31/2001 Document Revised: 06/23/2013 Document Reviewed: 12/17/2012 ExitCare Patient Information 2015 [employer]. This information is not intended to replace advice given to you by your health care provider. Make sure you discuss any questions you have with your health care provider.  

## 2014-07-30 NOTE — MAU Note (Signed)
Pt c/o increased abd pain. Had pain since last week. Seen in [facility] for bleeding and pain last wee. NO bleeding today but pain is much worse.

## 2014-07-30 NOTE — MAU Provider Note (Signed)
History     CSN: [id number]  Arrival date and time: 07/30/14 1557   None     Chief Complaint  Patient presents with  . Abdominal Pain   HPI Comments: Carol Nichols is a 24 y.o. G2P0010 at [redacted]w[redacted]d who presents today lower abdominal pain. She has had the pain since 07/17/14, and it has been getting worse. She denies any vaginal bleeding today.   Abdominal Pain This is a new problem. The current episode started 1 to 4 weeks ago. The onset quality is gradual. The problem occurs constantly. The problem has been gradually worsening. The pain is located in the suprapubic region. The pain is at a severity of 10/10. The quality of the pain is cramping. The abdominal pain does not radiate. Associated symptoms include constipation (last BM today, hard and formed ) and nausea. Pertinent negatives include no diarrhea, dysuria, fever, frequency or vomiting. Nothing aggravates the pain. The pain is relieved by nothing. She has tried nothing for the symptoms.     History reviewed. No pertinent past medical history.  Past Surgical History  Procedure Laterality Date  . Mouth surgery      Family History  Problem Relation Age of Onset  . Hypertension Mother     History  Substance Use Topics  . Smoking status: Former Smoker    Types: Cigarettes    Quit date: 07/15/2014  . Smokeless tobacco: Never Used  . Alcohol Use: No     Comment: 07/15/14 quit while pregnant    Allergies:  Allergies  Allergen Reactions  . Dust Mite Extract   . Pollen Extract     Prescriptions prior to admission  Medication Sig Dispense Refill Last Dose  . metroNIDAZOLE (FLAGYL) 500 MG tablet Take 500 mg by mouth 2 (two) times daily.   07/29/2014 at Unknown time  . Prenatal Vit-Fe Fumarate-FA (PRENATAL MULTIVITAMIN) TABS tablet Take 1 tablet by mouth daily at 12 noon.   07/29/2014 at Unknown time  . metroNIDAZOLE (METROGEL) 0.75 % vaginal gel Place 1 Applicatorful vaginally at bedtime. Apply one applicatorful to vagina at  bedtime for 5 days (Patient not taking: Reported on 07/30/2014) 70 g 1 Not Taking at Unknown time    Review of Systems  Constitutional: Negative for fever.  Gastrointestinal: Positive for nausea, abdominal pain and constipation (last BM today, hard and formed ). Negative for vomiting and diarrhea.  Genitourinary: Negative for dysuria, urgency and frequency.  Neurological: Positive for dizziness.   Physical Exam   Blood pressure 124/83, pulse 81, temperature 98.5 F (36.9 C), temperature source Oral, resp. rate 18, height  (1.575 m), weight 72.576 kg (160 lb), last menstrual period 05/31/2014, SpO2 98 %, unknown if currently breastfeeding.  Physical Exam  Nursing note and vitals reviewed. Constitutional: She is oriented to person, place, and time. She appears well-developed and well-nourished. No distress.  HENT:  Head: Normocephalic.  Cardiovascular: Normal rate.   Respiratory: Effort normal.  GI: Soft. There is no tenderness.  Neurological: She is alert and oriented to person, place, and time.  Skin: Skin is warm and dry.  Psychiatric: She has a normal mood and affect. Her behavior is normal.   Results for orders placed or performed during the hospital encounter of 07/30/14 (from the past 24 hour(s))  Urinalysis, Routine w reflex microscopic (not at [facility])     Status: None   Collection Time: 07/30/14  4:47 PM  Result Value Ref Heuring   Color, Urine YELLOW YELLOW   APPearance CLEAR CLEAR  Specific Gravity, Urine 1.020 1.005 - 1.030   pH 7.0 5.0 - 8.0   Glucose, UA NEGATIVE NEGATIVE mg/dL   Hgb urine dipstick NEGATIVE NEGATIVE   Bilirubin Urine NEGATIVE NEGATIVE   Ketones, ur NEGATIVE NEGATIVE mg/dL   Protein, ur NEGATIVE NEGATIVE mg/dL   Urobilinogen, UA 0.2 0.0 - 1.0 mg/dL   Nitrite NEGATIVE NEGATIVE   Leukocytes, UA NEGATIVE NEGATIVE  hCG, quantitative, pregnancy     Status: Abnormal   Collection Time: 07/30/14  5:35 PM  Result Value Ref Silvera   hCG, Beta Chain,  Quant, S [id number] (H) <5 mIU/mL  CBC     Status: None   Collection Time: 07/30/14  5:35 PM  Result Value Ref Yung   WBC 7.0 4.0 - 10.5 K/uL   RBC 4.59 3.87 - 5.11 MIL/uL   Hemoglobin 13.9 12.0 - 15.0 g/dL   HCT 60.4 54.0 - 98.1 %   MCV 86.7 78.0 - 100.0 fL   MCH 30.3 26.0 - 34.0 pg   MCHC 34.9 30.0 - 36.0 g/dL   RDW 19.1 47.8 - 29.5 %   Platelets 245 150 - 400 K/uL  US Ob Transvaginal  07/30/2014   CLINICAL DATA:  24 year old female with history of abdominal pain.  EXAM: TRANSVAGINAL OB ULTRASOUND  TECHNIQUE: Transvaginal ultrasound was performed for complete evaluation of the gestation as well as the maternal uterus, adnexal regions, and pelvic cul-de-sac.  COMPARISON:  07/23/2014  FINDINGS: Intrauterine gestational sac: Intrauterine gestational sac again evident at the uterine fundus, similar to comparison. Aspherical shape, though this is relatively unchanged.  Yolk sac:  Yolk sac identified, which is borderline enlarged.  Embryo: Questionable fetal pole on the margin of the yolk sac, which measures a crown-rump length of 3 mm -4 mm  Cardiac Activity: Not identified  MSD: 1.65  mm   6 w   4  d  CRL:   3.3  mm   6 w 0 d                  [place] EDC: 03/25/2015  Maternal uterus/adnexae: Cystic lesion of the right ovary, compatible with corpus luteum which was identified on the prior. Unremarkable appearance of the left ovary.  IMPRESSION: Gestational sac is unchanged in location at the uterine fundus, though is irregular, aspherical shape.  Questionable identification of embryo measuring a crown rump length of 3.3 mm. The yolk sac is borderline enlarged, and correlation with repeat beta HCG and interval ultrasound is recommended.  These results were called by telephone at the time of interpretation on 07/30/2014 at 9:13 pm to Dr. Thressa Sheller, who verbally acknowledged these results.  Signed,  Yvone Neu. Loreta Ave, DO  [facility]  [facility]   Electronically Signed    By: Gilmer Mor D.O.   On: 07/30/2014 21:13     MAU Course  Procedures  MDM   Assessment and Plan   1. Pregnancy with uncertain fetal viability, fetus 1   2. Abnormal human chorionic gonadotropin (hCG)   3. Pelvic pain affecting pregnancy in first trimester, antepartum    DC home Comfort measures reviewed  1st Trimester precautions  Bleeding precautions SAB precautions  RX: vicodin #20, 0RF  Return to MAU as needed FU with OB as planned Repeat US in one week  Follow-up Information    Follow up with [facility].   Why:  They will call you with an appointment         Thressa Sheller  [place] 07/30/2014, 9:17 PM

## 2014-08-07 ENCOUNTER — Inpatient Hospital Stay (HOSPITAL_COMMUNITY)
Admission: AD | Admit: 2014-08-07 | Discharge: 2014-08-07 | Disposition: A | Discharge status: Home / Self Care | Payer: Managed Care, Other (non HMO) | Source: Ambulatory Visit | Attending: Obstetrics & Gynecology | Admitting: Obstetrics & Gynecology

## 2014-08-07 ENCOUNTER — Ambulatory Visit (HOSPITAL_COMMUNITY)
Admission: RE | Admit: 2014-08-07 | Discharge: 2014-08-07 | Disposition: A | Discharge status: Home / Self Care | Payer: Managed Care, Other (non HMO) | Source: Ambulatory Visit | Attending: Advanced Practice Midwife | Admitting: Advanced Practice Midwife

## 2014-08-07 DIAGNOSIS — Z3A01 Less than 8 weeks gestation of pregnancy: Secondary | ICD-10-CM | POA: Insufficient documentation

## 2014-08-07 DIAGNOSIS — O208 Other hemorrhage in early pregnancy: Secondary | ICD-10-CM | POA: Insufficient documentation

## 2014-08-07 DIAGNOSIS — O288 Other abnormal findings on antenatal screening of mother: Secondary | ICD-10-CM | POA: Diagnosis not present

## 2014-08-07 DIAGNOSIS — O209 Hemorrhage in early pregnancy, unspecified: Secondary | ICD-10-CM

## 2014-08-07 DIAGNOSIS — O021 Missed abortion: Secondary | ICD-10-CM

## 2014-08-07 MED ORDER — OXYCODONE-ACETAMINOPHEN 5-325 MG PO TABS: 1.0000 | ORAL_TABLET | Freq: Four times a day (QID) | ORAL | Status: DC | PRN

## 2014-08-07 MED ORDER — IBUPROFEN 600 MG PO TABS: 600.0000 mg | ORAL_TABLET | Freq: Four times a day (QID) | ORAL | Status: DC | PRN

## 2014-08-07 MED ORDER — PROMETHAZINE HCL 12.5 MG PO TABS: 12.5000 mg | ORAL_TABLET | Freq: Four times a day (QID) | ORAL | Status: DC | PRN

## 2014-08-07 MED ORDER — MISOPROSTOL 200 MCG PO TABS: ORAL_TABLET | ORAL | Status: DC

## 2014-08-07 NOTE — MAU Provider Note (Signed)
Carol Nichols is a 24 y.o. G2P0010 at [redacted]w[redacted]d who presents to MAU today for follow-up US results. She denies abdominal pain, vaginal bleeding, N/V or fever today.   LMP 05/31/2014 (Approximate)  GENERAL: Well-developed, well-nourished female in no acute distress.  HEENT: Normocephalic, atraumatic.   LUNGS: Effort normal HEART: Regular rate  SKIN: Warm, dry and without erythema PSYCH: Normal mood and affect  US Ob Transvaginal  08/07/2014   CLINICAL DATA:  Pregnancy with inconclusive viability. Quantitative beta HCG on 07/30/2014 was 35,674. By LMP the patient is 9 weeks 5 days. EDC by LMP is 03/07/2015. By first ultrasound showing fetal pole, EDC is 03/25/2015.  EXAM: OBSTETRIC <14 WK [place] AND TRANSVAGINAL OB US  TECHNIQUE: Both transabdominal and transvaginal ultrasound examinations were performed for complete evaluation of the gestation as well as the maternal uterus, adnexal regions, and pelvic cul-de-sac. Transvaginal technique was performed to assess early pregnancy.  COMPARISON:  07/30/2014  FINDINGS: Intrauterine gestational sac: Present  Yolk sac:  Present  Embryo:  Present  Cardiac Activity: Not seen  CRL:  3.2  mm   6 w   0 d                  [place] EDC: 04/02/2015  Maternal uterus/adnexae: Normal appearing ovaries. Small subchorionic hemorrhage.  IMPRESSION: 1. Intrauterine embryo and small subchorionic hemorrhage. There is no detectable growth since the previous exam 8 days ago. 2. Findings are suspicious but not yet definitive for failed pregnancy. Recommend follow-up US in 10-14 days for definitive diagnosis. This recommendation follows SRU consensus guidelines: Diagnostic Criteria for Nonviable Pregnancy Early in the First Trimester. Malva Limes Med 2013; 915:0569-79.   Electronically Signed   By: Norva Pavlov M.D.   On: 08/07/2014 12:13   MDM Discussed with Dr. Erin Fulling. She agrees that [place] is consistent with failed pregnancy when compared to previous studies and labs.   A: Missed  AB  P: Discharge home Rx for Cytotec, Phenergan, Ibuprofen and Percocet given Comfort care package given Patient referred to [facility] [place] for follow-up in 2 weeks. They will call her with an appointment Bleeding precautions discussed Patient may return to MAU as needed or if her condition were to change or worsen   Marny Lowenstein, PA-C  08/07/2014 2:14 PM

## 2014-08-07 NOTE — Discharge Instructions (Signed)
Incomplete Miscarriage A miscarriage is the sudden loss of an unborn baby (fetus) before the 20th week of pregnancy. In an incomplete miscarriage, parts of the fetus or placenta (afterbirth) remain in the body.  Having a miscarriage can be an emotional experience. Talk with your health care provider about any questions you may have about miscarrying, the grieving process, and your future pregnancy plans. CAUSES   Problems with the fetal chromosomes that make it impossible for the baby to develop normally. Problems with the baby's genes or chromosomes are most often the result of errors that occur by chance as the embryo divides and grows. The problems are not inherited from the parents.  Infection of the cervix or uterus.  Hormone problems.  Problems with the cervix, such as having an incompetent cervix. This is when the tissue in the cervix is not strong enough to hold the pregnancy.  Problems with the uterus, such as an abnormally shaped uterus, uterine fibroids, or congenital abnormalities.  Certain medical conditions.  Smoking, drinking alcohol, or taking illegal drugs.  Trauma. SYMPTOMS   Vaginal bleeding or spotting, with or without cramps or pain.  Pain or cramping in the abdomen or lower back.  Passing fluid, tissue, or blood clots from the vagina. DIAGNOSIS  Your health care provider will perform a physical exam. You may also have an ultrasound to confirm the miscarriage. Blood or urine tests may also be ordered. TREATMENT   Usually, a dilation and curettage (D&C) procedure is performed. During a D&C procedure, the cervix is widened (dilated) and any remaining fetal or placental tissue is gently removed from the uterus.  Antibiotic medicines are prescribed if there is an infection. Other medicines may be given to reduce the size of the uterus (contract) if there is a lot of bleeding.  If you have Rh negative blood and your baby was Rh positive, you will need a Rho (D)  immune globulin shot. This shot will protect any future baby from having Rh blood problems in future pregnancies.  You may be confined to bed rest. This means you should stay in bed and only get up to use the bathroom. HOME CARE INSTRUCTIONS   Rest as directed by your health care provider.  Restrict activity as directed by your health care provider. You may be allowed to continue light activity if curettage was not done but you require further treatment.  Keep track of the number of pads you use each day. Keep track of how soaked (saturated) they are. Record this information.  Do not  use tampons.  Do not douche or have sexual intercourse until approved by your health care provider.  Keep all follow-up appointments for reevaluation and continuing management.  Only take over-the-counter or prescription medicines for pain, fever, or discomfort as directed by your health care provider.  Take antibiotic medicine as directed by your health care provider. Make sure you finish it even if you start to feel better. SEEK IMMEDIATE MEDICAL CARE IF:   You experience severe cramps in your stomach, back, or abdomen.  You have an unexplained temperature (make sure to record these temperatures).  You pass large clots or tissue (save these for your health care provider to inspect).  Your bleeding increases.  You become light-headed, weak, or have fainting episodes. MAKE SURE YOU:   Understand these instructions.  Will watch your condition.  Will get help right away if you are not doing well or get worse. Document Released: 02/06/2005 Document Revised: 06/23/2013 Document Reviewed:   09/05/2012 ExitCare Patient Information 2015 [employer]. This information is not intended to replace advice given to you by your health care provider. Make sure you discuss any questions you have with your health care provider.  

## 2014-08-21 ENCOUNTER — Ambulatory Visit: Payer: Managed Care, Other (non HMO) | Admitting: Medical

## 2014-09-01 ENCOUNTER — Encounter (HOSPITAL_COMMUNITY): Payer: Self-pay | Admitting: *Deleted

## 2014-09-01 ENCOUNTER — Inpatient Hospital Stay (HOSPITAL_COMMUNITY)
Admission: AD | Admit: 2014-09-01 | Discharge: 2014-09-01 | Disposition: A | Discharge status: Home / Self Care | Payer: Managed Care, Other (non HMO) | Source: Ambulatory Visit | Attending: Family Medicine | Admitting: Family Medicine

## 2014-09-01 DIAGNOSIS — F17210 Nicotine dependence, cigarettes, uncomplicated: Secondary | ICD-10-CM | POA: Insufficient documentation

## 2014-09-01 DIAGNOSIS — N3001 Acute cystitis with hematuria: Secondary | ICD-10-CM | POA: Diagnosis not present

## 2014-09-01 DIAGNOSIS — R300 Dysuria: Secondary | ICD-10-CM | POA: Diagnosis present

## 2014-09-01 LAB — URINALYSIS, ROUTINE W REFLEX MICROSCOPIC
Bilirubin Urine: NEGATIVE
Glucose, UA: NEGATIVE mg/dL
Ketones, ur: NEGATIVE mg/dL
Nitrite: POSITIVE — AB
Protein, ur: 100 mg/dL — AB
Specific Gravity, Urine: >1.03 — ABNORMAL HIGH (ref 1.005–1.030)
Urobilinogen, UA: 0.2 mg/dL (ref 0.0–1.0)
pH: 6 (ref 5.0–8.0)

## 2014-09-01 LAB — URINE MICROSCOPIC-ADD ON

## 2014-09-01 MED ORDER — PHENAZOPYRIDINE HCL 200 MG PO TABS: 200.0000 mg | ORAL_TABLET | Freq: Three times a day (TID) | ORAL | Status: DC

## 2014-09-01 MED ORDER — CEPHALEXIN 500 MG PO CAPS: 500.0000 mg | ORAL_CAPSULE | Freq: Three times a day (TID) | ORAL | Status: DC

## 2014-09-01 NOTE — MAU Provider Note (Signed)
CSN: [id number]     Arrival date & time 09/01/14  1639 History   None    Chief Complaint  Patient presents with  . Dysuria     (Consider location/radiation/quality/duration/timing/severity/associated sxs/prior Treatment) Patient is a 24 y.o. female presenting with urinary tract infection. The history is provided by the patient.  Urinary Tract Infection This is a new problem. The current episode started in the past 7 days. The problem occurs constantly. The problem has been unchanged. Associated symptoms include abdominal pain. Pertinent negatives include no nausea or vomiting.   Carol Nichols is a 24 y.o. female who presents to the MAU complaining of pain with urination. She reports having had a miscarriage 3 weeks ago. She reports this past week she started having frequent urination and pain and the end of the urinary stream. She denies vaginal bleeding or discharge. She is not concerned about STD's she just wants treatment for her UTI.   History reviewed. No pertinent past medical history. Past Surgical History  Procedure Laterality Date  . Mouth surgery     Family History  Problem Relation Age of Onset  . Hypertension Mother    History  Substance Use Topics  . Smoking status: Current Some Day Smoker -- 0.25 packs/day    Types: Cigarettes    Last Attempt to Quit: 07/15/2014  . Smokeless tobacco: Never Used  . Alcohol Use: No     Comment: 07/15/14 quit while pregnant   OB History    Gravida Para Term Preterm AB TAB SAB Ectopic Multiple Living   0     Review of Systems  Gastrointestinal: Positive for abdominal pain. Negative for nausea and vomiting.  Genitourinary: Positive for dysuria, urgency and frequency.   All other systems negative   Allergies  Dust mite extract and Pollen extract  Home Medications   Prior to Admission medications   Medication Sig Start Date End Date Taking? Authorizing Provider  HYDROcodone-acetaminophen (NORCO/VICODIN) 5-325 MG  per tablet Take 0.5-1 tablets by mouth every 6 (six) hours as needed for moderate pain.   Yes Historical Provider, MD  Prenatal Vit-Fe Fumarate-FA (PRENATAL MULTIVITAMIN) TABS tablet Take 1 tablet by mouth daily at 12 noon.   Yes Historical Provider, MD  cephALEXin (KEFLEX) 500 MG capsule Take 1 capsule (500 mg total) by mouth 3 (three) times daily. 09/01/14   Hope Orlene Och, NP  ibuprofen (ADVIL,MOTRIN) 600 MG tablet Take 1 tablet (600 mg total) by mouth every 6 (six) hours as needed. Patient not taking: Reported on 09/01/2014 08/07/14   Marny Lowenstein, PA-C  misoprostol (CYTOTEC) 200 MCG tablet Place 4 tabs (800 mcg) vaginally once Patient not taking: Reported on 09/01/2014 08/07/14   Marny Lowenstein, PA-C  oxyCODONE-acetaminophen (PERCOCET/ROXICET) 5-325 MG per tablet Take 1 tablet by mouth every 6 (six) hours as needed for severe pain. Patient not taking: Reported on 09/01/2014 08/07/14   Marny Lowenstein, PA-C  phenazopyridine (PYRIDIUM) 200 MG tablet Take 1 tablet (200 mg total) by mouth 3 (three) times daily. 09/01/14   Hope Orlene Och, NP  promethazine (PHENERGAN) 12.5 MG tablet Take 1 tablet (12.5 mg total) by mouth every 6 (six) hours as needed for nausea or vomiting. Patient not taking: Reported on 09/01/2014 08/07/14   Marny Lowenstein, PA-C   BP 117/77 mmHg  Pulse 72  Temp(Src) 98.4 F (36.9 C) (Oral)  Resp 18  Ht 5' 4.5" (1.638 m)  Wt 163 lb (73.936 kg)  BMI 27.56 kg/m2  LMP 05/31/2014 (Approximate)  Breastfeeding? Unknown Physical Exam  Constitutional: She is oriented to person, place, and time. She appears well-developed and well-nourished. No distress.  Eyes: EOM are normal.  Neck: Neck supple.  Cardiovascular: Normal rate, regular rhythm and normal heart sounds.   Pulmonary/Chest: Effort normal.  Abdominal: Soft. Bowel sounds are normal. There is tenderness in the suprapubic area. There is no rebound, no guarding and no CVA tenderness.  Genitourinary:  Patient declined pelvic exam.    Musculoskeletal: Normal Lovering of motion. She exhibits no edema.  Neurological: She is alert and oriented to person, place, and time. No cranial nerve deficit.  Skin: Skin is warm and dry.  Psychiatric: She has a normal mood and affect. Her behavior is normal.  Nursing note and vitals reviewed.   ED Course  Procedures (including critical care time) Labs Review Results for orders placed or performed during the hospital encounter of 09/01/14 (from the past 24 hour(s))  Urinalysis, Routine w reflex microscopic ([facility])     Status: Abnormal   Collection Time: 09/01/14  4:50 PM  Result Value Ref Keidel   Color, Urine YELLOW YELLOW   APPearance CLOUDY (A) CLEAR   Specific Gravity, Urine >1.030 (H) 1.005 - 1.030   pH 6.0 5.0 - 8.0   Glucose, UA NEGATIVE NEGATIVE mg/dL   Hgb urine dipstick LARGE (A) NEGATIVE   Bilirubin Urine NEGATIVE NEGATIVE   Ketones, ur NEGATIVE NEGATIVE mg/dL   Protein, ur 295100 (A) NEGATIVE mg/dL   Urobilinogen, UA 0.2 0.0 - 1.0 mg/dL   Nitrite POSITIVE (A) NEGATIVE   Leukocytes, UA MODERATE (A) NEGATIVE  Urine microscopic-add on     Status: Abnormal   Collection Time: 09/01/14  4:50 PM  Result Value Ref N[place]ise   Squamous Epithelial / LPF MANY (A) RARE   WBC, UA TOO NUMEROUS TO COUNT <3 WBC/hpf   Bacteria, UA MANY (A) RARE   Urine-Other FIELD OBSCURED BY WBC'S      MDM  24 y.o. female with dysuria, frequency and urgency that has gotten worse over the past week. Stable for d/c without fever or signs of pyelo. Discussed clinical and lab findings and plan of care and all questions answered. Patient agrees with plan of care. Urine sent for culture. Will start Keflex and Pyridium.   Final diagnoses:  Acute cystitis with hematuria

## 2014-09-01 NOTE — MAU Note (Addendum)
Had miscarriage end of June. (missed AB, stopped bleeding first of July) did not have follow up.  Having pain with urination, frequency and urgency.

## 2015-02-21 NOTE — L&D Delivery Note (Signed)
Pt was admitted for induction. She has cytotec then AROM  And pit aug this am. She progressed along a nl labor curve. She was OP but spont rotated to OA. Pt pushed for 2 hours. She had a SVD on one live viable black female infant over an intact perineum in the ROA position. Nuchal cord x 1. Placenta -S/I. EBL-400cc. Baby to NBN.

## 2015-06-21 ENCOUNTER — Encounter (HOSPITAL_COMMUNITY): Payer: Self-pay

## 2015-06-21 ENCOUNTER — Emergency Department (HOSPITAL_COMMUNITY)
Admission: EM | Admit: 2015-06-21 | Discharge: 2015-06-21 | Disposition: A | Discharge status: Other Acute Inpatient Hospital | Payer: Managed Care, Other (non HMO) | Attending: Emergency Medicine | Admitting: Emergency Medicine

## 2015-06-21 ENCOUNTER — Inpatient Hospital Stay (HOSPITAL_COMMUNITY)
Admission: EM | Admit: 2015-06-21 | Discharge: 2015-06-25 | DRG: 885 | Disposition: A | Discharge status: Home / Self Care | Payer: Managed Care, Other (non HMO) | Source: Intra-hospital | Attending: Psychiatry | Admitting: Psychiatry

## 2015-06-21 DIAGNOSIS — Z8249 Family history of ischemic heart disease and other diseases of the circulatory system: Secondary | ICD-10-CM | POA: Diagnosis not present

## 2015-06-21 DIAGNOSIS — F329 Major depressive disorder, single episode, unspecified: Secondary | ICD-10-CM | POA: Insufficient documentation

## 2015-06-21 DIAGNOSIS — G4700 Insomnia, unspecified: Secondary | ICD-10-CM | POA: Diagnosis present

## 2015-06-21 DIAGNOSIS — Z811 Family history of alcohol abuse and dependence: Secondary | ICD-10-CM

## 2015-06-21 DIAGNOSIS — F1210 Cannabis abuse, uncomplicated: Secondary | ICD-10-CM | POA: Diagnosis not present

## 2015-06-21 DIAGNOSIS — Z3202 Encounter for pregnancy test, result negative: Secondary | ICD-10-CM | POA: Insufficient documentation

## 2015-06-21 DIAGNOSIS — F419 Anxiety disorder, unspecified: Secondary | ICD-10-CM | POA: Diagnosis present

## 2015-06-21 DIAGNOSIS — F332 Major depressive disorder, recurrent severe without psychotic features: Principal | ICD-10-CM | POA: Insufficient documentation

## 2015-06-21 DIAGNOSIS — F17210 Nicotine dependence, cigarettes, uncomplicated: Secondary | ICD-10-CM | POA: Diagnosis present

## 2015-06-21 DIAGNOSIS — Z79899 Other long term (current) drug therapy: Secondary | ICD-10-CM | POA: Diagnosis not present

## 2015-06-21 DIAGNOSIS — R45851 Suicidal ideations: Secondary | ICD-10-CM | POA: Diagnosis present

## 2015-06-21 DIAGNOSIS — Z915 Personal history of self-harm: Secondary | ICD-10-CM

## 2015-06-21 LAB — COMPREHENSIVE METABOLIC PANEL
ALT: 13 U/L — ABNORMAL LOW (ref 14–54)
AST: 19 U/L (ref 15–41)
Albumin: 4.5 g/dL (ref 3.5–5.0)
Alkaline Phosphatase: 50 U/L (ref 38–126)
Anion gap: 6 (ref 5–15)
BUN: 7 mg/dL (ref 6–20)
CO2: 26 mmol/L (ref 22–32)
Calcium: 9.3 mg/dL (ref 8.9–10.3)
Chloride: 107 mmol/L (ref 101–111)
Creatinine, Ser: 0.76 mg/dL (ref 0.44–1.00)
GFR calc Af Amer: >60 mL/min (ref >60)
GFR calc non Af Amer: >60 mL/min (ref >60)
Glucose, Bld: 86 mg/dL (ref 65–99)
Potassium: 4.5 mmol/L (ref 3.5–5.1)
Sodium: 139 mmol/L (ref 135–145)
Total Bilirubin: 0.8 mg/dL (ref 0.3–1.2)
Total Protein: 7.6 g/dL (ref 6.5–8.1)

## 2015-06-21 LAB — CBC
HCT: 41.8 % (ref 36.0–46.0)
Hemoglobin: 14.5 g/dL (ref 12.0–15.0)
MCH: 30.1 pg (ref 26.0–34.0)
MCHC: 34.7 g/dL (ref 30.0–36.0)
MCV: 86.9 fL (ref 78.0–100.0)
Platelets: 248 10*3/uL (ref 150–400)
RBC: 4.81 MIL/uL (ref 3.87–5.11)
RDW: 12.6 % (ref 11.5–15.5)
WBC: 5.3 10*3/uL (ref 4.0–10.5)

## 2015-06-21 LAB — RAPID URINE DRUG SCREEN, HOSP PERFORMED
Amphetamines: NOT DETECTED
Barbiturates: NOT DETECTED
Benzodiazepines: NOT DETECTED
Cocaine: NOT DETECTED
Opiates: NOT DETECTED
Tetrahydrocannabinol: POSITIVE — AB

## 2015-06-21 LAB — SALICYLATE LEVEL: Salicylate Lvl: <4 mg/dL (ref 2.8–30.0)

## 2015-06-21 LAB — ACETAMINOPHEN LEVEL: Acetaminophen (Tylenol), Serum: <10 ug/mL — ABNORMAL LOW (ref 10–30)

## 2015-06-21 LAB — I-STAT BETA HCG BLOOD, ED (MC, WL, AP ONLY): I-stat hCG, quantitative: <5 m[IU]/mL (ref <5)

## 2015-06-21 LAB — ETHANOL: Alcohol, Ethyl (B): <5 mg/dL (ref <5)
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
## 2015-06-21 NOTE — Progress Notes (Signed)
Attempted to call report. [facility] will call writer back when available.

## 2015-06-21 NOTE — ED Notes (Signed)
Patient appears tearful, cooperative. Denies SI, HI, AVH at present. States that she has been depressed since her miscarriage last June. Reports mild anxiety related to being in the hospital. States sleep has been poor and appetite with unknown amount of weight loss.   Encouragement offered. Oriented to the unit.   Q 15 safety checks in place.

## 2015-06-21 NOTE — BH Assessment (Signed)
Assessment completed. Consulted Donell SievertSpencer Simon, PA-C who agrees pt meets inpatient criteria. TTS to seek placement. Informed [name], PA-C of the recommendation.

## 2015-06-21 NOTE — BH Assessment (Signed)
[facility]Dr. Jama Flavorsobos). Informed Dr. Anitra LauthPlunkett of the disposition. Support paperwork will be completed.
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
## 2015-06-21 NOTE — ED Notes (Signed)
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

## 2015-06-21 NOTE — ED Provider Notes (Signed)
CSN: [id number]     Arrival date & time 06/21/15  1614 History   First MD Initiated Contact with Patient 06/21/15 1849     Chief Complaint  Patient presents with  . Depression  . Suicidal   (Consider location/radiation/quality/duration/timing/severity/associated sxs/prior Treatment) HPI 25 y.o. female  presents to the Emergency Department today due to suicidal ideation. She does not have a plan. No HI. States for the past few weeks she has been feeling an increase in depression as she nears the day she had a miscarriage in July 2016. Pt states that she smoked marijuana last night and notes no ETOH today. No hx ETOH abuse. No visual/auditory hallucinations. No fevers. No CP/SOB/ABD pain. No N/V/D. No other symptoms noted.      History reviewed. No pertinent past medical history. Past Surgical History  Procedure Laterality Date  . Mouth surgery     Family History  Problem Relation Age of Onset  . Hypertension Mother    Social History  Substance Use Topics  . Smoking status: Current Some Day Smoker -- 0.25 packs/day    Types: Cigarettes    Last Attempt to Quit: 07/15/2014  . Smokeless tobacco: Never Used  . Alcohol Use: 7.2 oz/week    2 Cans of beer, 10 Shots of liquor per week     Comment: socially   OB History    Gravida Para Term Preterm AB TAB SAB Ectopic Multiple Living   0     Review of Systems ROS reviewed and all are negative for acute change except as noted in the HPI.  Allergies  Dust mite extract and Pollen extract  Home Medications   Prior to Admission medications   Medication Sig Start Date End Date Taking? Authorizing Provider  BIOTIN PO Take 1 tablet by mouth daily.   Yes Historical Provider, MD   BP 132/111 mmHg  Pulse 70  Temp(Src) 98.2 F (36.8 C) (Oral)  Resp 18  Ht  (1.651 m)  Wt 72.576 kg  BMI 26.63 kg/m2  SpO2 100%  LMP  (LMP Unknown) Physical Exam  Constitutional: She is oriented to person, place, and time. She appears  well-developed and well-nourished.  HENT:  Head: Normocephalic and atraumatic.  Eyes: EOM are normal.  Cardiovascular: Normal rate, regular rhythm, normal heart sounds and intact distal pulses.   No murmur heard. Pulmonary/Chest: Effort normal and breath sounds normal. No respiratory distress. She has no wheezes. She has no rales. She exhibits no tenderness.  Abdominal: Soft. Bowel sounds are normal. There is no tenderness.  Musculoskeletal: Normal Hannon of motion.  Neurological: She is alert and oriented to person, place, and time.  Skin: Skin is warm and dry.  Psychiatric: She has a normal mood and affect. Her behavior is normal. She expresses suicidal ideation. She expresses no homicidal ideation. She expresses no suicidal plans and no homicidal plans.  Nursing note and vitals reviewed.   ED Course  Procedures (including critical care time) Labs Review Labs Reviewed  CBC  COMPREHENSIVE METABOLIC PANEL  ETHANOL  SALICYLATE LEVEL  ACETAMINOPHEN LEVEL  URINE RAPID DRUG SCREEN, HOSP PERFORMED  I-STAT BETA HCG BLOOD, ED ([facility], [facility], [facility] ONLY)   Imaging Review No results found. I have personally reviewed and evaluated these images and lab results as part of my medical decision-making.   EKG Interpretation None      MDM  I have reviewed and evaluated the relevant laboratory values I have reviewed  the relevant previous healthcare records. I obtained HPI from historian.  ED Course:  Assessment: Pt presents to the hospital with the chief complaint of depression. Pt has been suffering from depression for several months., however recently it has gotten much worse & it is interfering with pts daily living. Pt has SI, but denies HI. Pt denies any cardiac & respiratory s/s including CP, SOB, DOE, cough, leg swelling, or palpitations. Pt also denies urinary s/s of hematuria, dysuria, frequency and urgency. Pt currently has no medical complaints is hemodynamically stable and does not appear to  be in acute distress. Pt [facility] examined and [facility] TTS has been paged for consult, holding orders placed, & necessary labs ordered.   Disposition/Plan:  Pending TTS Consult  Supervising Physician Richardean Canalavid H Yao, MD   Final diagnoses:  Suicidal ideation       Audry Piliyler Debrina Kizer, PA-C 06/21/15 1904  Richardean Canalavid H Yao, MD 06/21/15 [phone]

## 2015-06-21 NOTE — ED Notes (Signed)
Bed: [facility] Expected date:  Expected time:  Means of arrival:  Comments: 

## 2015-06-22 ENCOUNTER — Encounter (HOSPITAL_COMMUNITY): Payer: Self-pay | Admitting: *Deleted

## 2015-06-22 DIAGNOSIS — F329 Major depressive disorder, single episode, unspecified: Secondary | ICD-10-CM | POA: Diagnosis present

## 2015-06-22 DIAGNOSIS — F332 Major depressive disorder, recurrent severe without psychotic features: Principal | ICD-10-CM

## 2015-06-22 MED ORDER — CITALOPRAM HYDROBROMIDE 10 MG PO TABS
10.0000 mg | ORAL_TABLET | Freq: Every day | ORAL | Status: DC
  Administered 2015-06-22 – 2015-06-23 (×2): 10 mg via ORAL
  Filled 2015-06-22 (×5): qty 1

## 2015-06-22 MED ORDER — MAGNESIUM HYDROXIDE 400 MG/5ML PO SUSP: 30.0000 mL | Freq: Every day | ORAL | Status: DC | PRN

## 2015-06-22 MED ORDER — TRAZODONE HCL 50 MG PO TABS
50.0000 mg | ORAL_TABLET | Freq: Every evening | ORAL | Status: DC | PRN
  Administered 2015-06-22 – 2015-06-24 (×4): 50 mg via ORAL
  Filled 2015-06-22 (×4): qty 1

## 2015-06-22 MED ORDER — ACETAMINOPHEN 325 MG PO TABS: 650.0000 mg | ORAL_TABLET | Freq: Four times a day (QID) | ORAL | Status: DC | PRN

## 2015-06-22 MED ORDER — ENSURE ENLIVE PO LIQD
237.0000 mL | Freq: Two times a day (BID) | ORAL | Status: DC
  Administered 2015-06-22 – 2015-06-23 (×2): 237 mL via ORAL

## 2015-06-22 MED ORDER — HYDROXYZINE HCL 25 MG PO TABS
25.0000 mg | ORAL_TABLET | ORAL | Status: DC | PRN
  Administered 2015-06-23 – 2015-06-24 (×3): 25 mg via ORAL
  Filled 2015-06-22 (×3): qty 1

## 2015-06-22 MED ORDER — ALUM & MAG HYDROXIDE-SIMETH 200-200-20 MG/5ML PO SUSP: 30.0000 mL | ORAL | Status: DC | PRN

## 2015-06-22 MED ORDER — NICOTINE POLACRILEX 2 MG MT GUM
2.0000 mg | CHEWING_GUM | OROMUCOSAL | Status: DC | PRN
Start: 2015-06-22 — End: 2015-06-25
  Administered 2015-06-22: 2 mg via ORAL
  Filled 2015-06-22: qty 1

## 2015-06-22 NOTE — Progress Notes (Signed)
Adult Psychoeducational Group Note  Date:  06/22/2015 Time:  9:08 PM  Group Topic/Focus:  Wrap-Up Group:   The focus of this group is to help patients review their daily goal of treatment and discuss progress on daily workbooks.  Participation Level:  Active  Participation Quality:  Appropriate  Affect:  Appropriate  Cognitive:  Alert  Insight: Appropriate  Engagement in Group:  Engaged  Modes of Intervention:  Discussion  Additional Comments:  Pt rated her day 8/10. The question asked to her was what are 3 wishes she has: She asked 1. Her mom would live forever 2. She would live forever 3. Go back in time  Kalen Neidert R 06/22/2015, 9:08 PM

## 2015-06-22 NOTE — Progress Notes (Signed)
Pt has been observed sitting in the dayroom most of the evening talking with peers and watching TV.  She denies SI/HI/AVH.  She had a visit with her mother this evening and said that it "lifted her mood" as she says she worries about her mother and it relieved her to see her mother doing well.  She denies SI/HI/AVH.  She voiced no needs or concerns.  She requested a sleep aid at bedtime which was given.  Pt has been pleasant and cooperative with staff.  Support and encouragement offered.  Discharge plans are in process.  Safety maintained with q15 minute checks.
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
## 2015-06-22 NOTE — Tx Team (Signed)
Initial Interdisciplinary Treatment Plan   PATIENT STRESSORS: Traumatic event   PATIENT STRENGTHS: Average or above average intelligence Communication skills General fund of knowledge Work skills   PROBLEM LIST: Problem List/Patient Goals Date to be addressed Date deferred Reason deferred Estimated date of resolution  "find a way to move past my anxiety"  06/21/15     "love myself, be happy, trust myself"  06/21/15           depression 06/21/15     SI 06/21/15                              DISCHARGE CRITERIA:  Improved stabilization in mood, thinking, and/or behavior  PRELIMINARY DISCHARGE PLAN: Outpatient therapy  PATIENT/FAMIILY INVOLVEMENT: This treatment plan has been presented to and reviewed with the patient, Carol Nichols.  The patient and family have been given the opportunity to ask questions and make suggestions.  Arrie AranChurch, Aribelle Mccosh J 06/22/2015, 12:56 AM

## 2015-06-22 NOTE — Progress Notes (Signed)
Recreation Therapy Notes  Animal-Assisted Activity (AAA) Program Checklist/Progress Notes Patient Eligibility Criteria Checklist & Daily Group note for Rec Tx Intervention  Date: 05.02.2017 Time: 2:45pm Location: [address] Dayroom   AAA/T Program Assumption of Risk Form signed by Patient/ or Parent Legal Guardian Yes  Patient is free of allergies or sever asthma Yes  Patient reports no fear of animals Yes  Patient reports no history of cruelty to animals Yes  Patient understands his/her participation is voluntary Yes  Patient washes hands before animal contact Yes  Patient washes hands after animal contact Yes  Behavioral Response: Engaged, Appropriate  Education: Hand Washing, Appropriate Animal Interaction   Education Outcome: Acknowledges education.   Clinical Observations/Feedback: Patient interacted appropriately with therapy dog and peers during session.    Carol Nichols L Carol Nichols, Carol Nichols        [name] L 06/22/2015 3:02 PM 

## 2015-06-22 NOTE — Progress Notes (Signed)
NUTRITION NOTE   Height: Ht Readings from Last 1 Encounters:  06/21/15 5\' 5"  (1.651 m)    Weight: Wt Readings from Last 1 Encounters:  06/21/15 164 lb (74.39 kg)    Weight Hx: Wt Readings from Last 10 Encounters:  06/21/15 164 lb (74.39 kg)  06/21/15 160 lb (72.576 kg)  09/01/14 163 lb (73.936 kg)  07/30/14 160 lb (72.576 kg)  07/23/14 159 lb 2 oz (72.179 kg)  10/30/12 162 lb (73.483 kg)    BMI:  Body mass index is 27.29 kg/(m^2). Pt meets criteria for overweight based on current BMI.  Pt screened for MST. Pt was unsure of weight changes PTA. She noted having a miscarriage in July 2016. Chart review indicates that pt weight has been stable (159-163 lbs) since June 2016.  Diet Order: Diet regular Room service appropriate?: Yes; Fluid consistency:: Thin Pt is also offered choice of unit snacks mid-morning and mid-afternoon.  Pt is eating as desired.   Lab results and medications reviewed.     Trenton GammonJessica Lizzett Nobile, RD, LDN Inpatient Clinical Dietitian Pager # [phone] After hours/weekend pager # [phone]

## 2015-06-22 NOTE — Progress Notes (Signed)
D: Patient denies SI/HI and A/V hallucinations; patient reports sleep is good; reports appetite is good; reports energy level is normal; reports ability to concentrate as good; rates depression as 0/10; rates hopelessness 0/10; rates anxiety as 3/10; patient has had no complaints this morning; patient reports that her goal is to find a way to love and express herself better  A: Monitored q 15 minutes; patient encouraged to attend groups; patient educated about medications; patient given medications per physician orders; patient encouraged to express feelings and/or concerns  R: Patient is cooperative and pleasant; patient is animated; patient's interaction with staff and peers is approrpiate; patient was able to set goal to talk with staff 1:1 when having feelings of SI; patient is taking medications as prescribed and tolerating medications; patient is attending all groups

## 2015-06-22 NOTE — H&P (Signed)
Psychiatric Admission Assessment Adult  Patient Identification: Carol Nichols  MRN:  [id number]  Date of Evaluation:  06/22/2015  Chief Complaint: Worsening symptoms of anxiety  Principal Diagnosis: Major depression ([place])  Diagnosis:   Patient Active Problem List   Diagnosis Date Noted  . Major depression ([place]) [F32.9] 06/22/2015   History of Present Illness: This is Carol Nichols first psychiatric admission in this hospital. She is a 25 year old African-American female. Admitted to [facility] from the [facility] ED with complaints of suicidal ideations & worsening symptoms of depression. She is seeking mood stabilization treatments. During this admission assessment, Carol Nichols reports, "I drove myself to the [facility] yesterday because I needed somebody to talk to about my anxiety. I have been wanting to talk to someone since last year. I lost my baby, it was a 7 weeks pregnancy. I miscarried it. I have been feeling extreme sadness & numbness since this happened. This feelings are not an everyday thing, rather, it overwhelms me because it is very intense. The sadness is usually triggered when I get rejected by guys. I would say that I was depressed for a good 6 months after I miscarried my pregnancy. These days, I feel more anxiety than anything else. I have never been diagnosed with depression or anxiety. I have never taken any medicines for depression or anxiety. I am interested in trying some medicine for my depression & anxiety. I smoke weed daily. I don't use drugs or drink alcohol. I'm not currently suicidal, but, I have a hx of suicide attempt by overdose during my teen years (2012). It was because I guy that I liked, did not like me.  I smoke a half a pack of cigarettes daily".  Associated Signs/Symptoms:  Depression Symptoms:  depressed mood, insomnia, anxiety,  (Hypo) Manic Symptoms:  Denies any hypomanic episodes  Anxiety Symptoms:  Excessive Worry,  Psychotic Symptoms:   Denies any hallucinations, delusional thoughts or paranoia.   PTSD Symptoms: Denies any PTSD symptoms.  Total Time spent with patient: 1 hour  Past Psychiatric History: Major depression  Is the patient at risk to self? No.  Has the patient been a risk to self in the past 6 months? No.  Has the patient been a risk to self within the distant past? Yes.    (Took an overdose). Is the patient a risk to others? No.  Has the patient been a risk to others in the past 6 months? No.  Has the patient been a risk to others within the distant past? No.   Prior Inpatient Therapy: No Prior Outpatient Therapy: No  Alcohol Screening: 1. How often do you have a drink containing alcohol?: Monthly or less 2. How many drinks containing alcohol do you have on a typical day when you are drinking?: 1 or 2 3. How often do you have six or more drinks on one occasion?: Never Preliminary Score: 0 4. How often during the last year have you found that you were not able to stop drinking once you had started?: Never 5. How often during the last year have you failed to do what was normally expected from you becasue of drinking?: Never 6. How often during the last year have you needed a first drink in the morning to get yourself going after a heavy drinking session?: Never 7. How often during the last year have you had a feeling of guilt of remorse after drinking?: Never 8. How often during the last year have you been  unable to remember what happened the night before because you had been drinking?: Never 9. Have you or someone else been injured as a result of your drinking?: No 10. Has a relative or friend or a doctor or another health worker been concerned about your drinking or suggested you cut down?: No Alcohol Use Disorder Identification Test Final Score (AUDIT): 1 Brief Intervention: AUDIT score less than 7 or less-screening does not suggest unhealthy drinking-brief intervention not indicated  Substance Abuse  History in the last 12 months:  Yes.  Sheran Fava)  Consequences of Substance Abuse: Medical Consequences:  Liver damage, Possible death by overdose Legal Consequences:  Arrests, jail time, Loss of driving privilege. Family Consequences:  Family discord, divorce and or separation.   Previous Psychotropic Medications: No   Psychological Evaluations: Yes   Past Medical History: History reviewed. No pertinent past medical history.  Past Surgical History  Procedure Laterality Date  . Mouth surgery     Family History:  Family History  Problem Relation Age of Onset  . Hypertension Mother    Family Psychiatric  History: Alcoholism: Father  Tobacco Screening: Smokes a half packs of cigarette daily.  Social History:  History  Alcohol Use No    Comment: socially     History  Drug Use  . 7.00 per week  . Special: Marijuana    Additional Social History: Pain Medications: denies Prescriptions: denies Over the Counter: denies History of alcohol / drug use?: Yes Name of Substance 1: THC  1 - Age of First Use: 12 1 - Amount (size/oz): 3 blunts 1 - Frequency: daily  1 - Duration: 7 years  1 - Last Use / Amount: 06-21-15  Allergies:   Allergies  Allergen Reactions  . Dust Mite Extract Other (See Comments) and Cough    Causes congestion, sore throat  . Pollen Extract Other (See Comments) and Cough    Causes congestion, sore throat   Lab Results:  Results for orders placed or performed during the hospital encounter of 06/21/15 (from the past 48 hour(s))  Comprehensive metabolic panel     Status: Abnormal   Collection Time: 06/21/15  6:22 PM  Result Value Ref Othman   Sodium 139 135 - 145 mmol/L   Potassium 4.5 3.5 - 5.1 mmol/L   Chloride 107 101 - 111 mmol/L   CO2 26 22 - 32 mmol/L   Glucose, Bld 86 65 - 99 mg/dL   BUN 7 6 - 20 mg/dL   Creatinine, Ser 5.42 0.44 - 1.00 mg/dL   Calcium 9.3 8.9 - 52.0 mg/dL   Total Protein 7.6 6.5 - 8.1 g/dL   Albumin 4.5 3.5 - 5.0 g/dL   AST 19  15 - 41 U/L   ALT 13 (L) 14 - 54 U/L   Alkaline Phosphatase 50 38 - 126 U/L   Total Bilirubin 0.8 0.3 - 1.2 mg/dL   GFR calc non Af Amer >60 >60 mL/min   GFR calc Af Amer >60 >60 mL/min    Comment: (NOTE) The eGFR has been calculated using the CKD EPI equation. This calculation has not been validated in all clinical situations. eGFR's persistently <60 mL/min signify possible Chronic Kidney Disease.    Anion gap 6 5 - 15  Ethanol     Status: None   Collection Time: 06/21/15  6:22 PM  Result Value Ref Raver   Alcohol, Ethyl (B) <5 <5 mg/dL    Comment:        LOWEST DETECTABLE LIMIT FOR  SERUM ALCOHOL IS 5 mg/dL FOR MEDICAL PURPOSES ONLY   Salicylate level     Status: None   Collection Time: 06/21/15  6:22 PM  Result Value Ref Murdoch   Salicylate Lvl <1.8 2.8 - 30.0 mg/dL  Acetaminophen level     Status: Abnormal   Collection Time: 06/21/15  6:22 PM  Result Value Ref Abaya   Acetaminophen (Tylenol), Serum <10 (L) 10 - 30 ug/mL    Comment:        THERAPEUTIC CONCENTRATIONS VARY SIGNIFICANTLY. A Durio OF 10-30 ug/mL MAY BE AN EFFECTIVE CONCENTRATION FOR MANY PATIENTS. HOWEVER, SOME ARE BEST TREATED AT CONCENTRATIONS OUTSIDE THIS Stenzel. ACETAMINOPHEN CONCENTRATIONS >150 ug/mL AT 4 HOURS AFTER INGESTION AND >50 ug/mL AT 12 HOURS AFTER INGESTION ARE OFTEN ASSOCIATED WITH TOXIC REACTIONS.   cbc     Status: None   Collection Time: 06/21/15  6:22 PM  Result Value Ref [place]   WBC 5.3 4.0 - 10.5 K/uL   RBC 4.81 3.87 - 5.11 MIL/uL   Hemoglobin 14.5 12.0 - 15.0 g/dL   HCT 41.8 36.0 - 46.0 %   MCV 86.9 78.0 - 100.0 fL   MCH 30.1 26.0 - 34.0 pg   MCHC 34.7 30.0 - 36.0 g/dL   RDW 12.6 11.5 - 15.5 %   Platelets 248 150 - 400 K/uL  I-Stat beta hCG blood, ED     Status: None   Collection Time: 06/21/15  6:28 PM  Result Value Ref Gramling   I-stat hCG, quantitative <5.0 <5 mIU/mL   Comment 3            Comment:   GEST. AGE      CONC.  (mIU/mL)   <=1 WEEK        5 - 50     2 WEEKS        50 - 500     3 WEEKS       100 - 10,000     4 WEEKS     1,000 - 30,000        FEMALE AND NON-PREGNANT FEMALE:     LESS THAN 5 mIU/mL   Rapid urine drug screen (hospital performed)     Status: Abnormal   Collection Time: 06/21/15  7:40 PM  Result Value Ref Cuneo   Opiates NONE DETECTED NONE DETECTED   Cocaine NONE DETECTED NONE DETECTED   Benzodiazepines NONE DETECTED NONE DETECTED   Amphetamines NONE DETECTED NONE DETECTED   Tetrahydrocannabinol POSITIVE (A) NONE DETECTED   Barbiturates NONE DETECTED NONE DETECTED    Comment:        DRUG SCREEN FOR MEDICAL PURPOSES ONLY.  IF CONFIRMATION IS NEEDED FOR ANY PURPOSE, NOTIFY LAB WITHIN 5 DAYS.        LOWEST DETECTABLE LIMITS FOR URINE DRUG SCREEN Drug Class       Cutoff (ng/mL) Amphetamine      1000 Barbiturate      200 Benzodiazepine   299 Tricyclics       371 Opiates          300 Cocaine          300 THC              50    Blood Alcohol level:  Lab Results  Component Value Date   ETH <5 69/67/8938   Metabolic Disorder Labs:  No results found for: HGBA1C, MPG No results found for: PROLACTIN No results found for: CHOL, TRIG, HDL, CHOLHDL, VLDL, LDLCALC  Current  Medications: Current Facility-Administered Medications  Medication Dose Route Frequency Provider Last Rate Last Dose  . acetaminophen (TYLENOL) tablet 650 mg  650 mg Oral Q6H PRN Laverle Hobby, PA-C      . alum & mag hydroxide-simeth (MAALOX/MYLANTA) 200-200-20 MG/5ML suspension 30 mL  30 mL Oral Q4H PRN Laverle Hobby, PA-C      . citalopram (CELEXA) tablet 10 mg  10 mg Oral Daily Encarnacion Slates, NP      . feeding supplement (ENSURE ENLIVE) (ENSURE ENLIVE) liquid 237 mL  237 mL Oral BID BM Jenne Campus, MD   237 mL at 06/22/15 0959  . hydrOXYzine (ATARAX/VISTARIL) tablet 25 mg  25 mg Oral Q4H PRN Encarnacion Slates, NP      . magnesium hydroxide (MILK OF MAGNESIA) suspension 30 mL  30 mL Oral Daily PRN Laverle Hobby, PA-C      . nicotine polacrilex  (NICORETTE) gum 2 mg  2 mg Oral PRN Jenne Campus, MD   2 mg at 06/22/15 8242  . traZODone (DESYREL) tablet 50 mg  50 mg Oral QHS PRN,MR X 1 Spencer E Simon, PA-C   50 mg at 06/22/15 0028   PTA Medications: Prescriptions prior to admission  Medication Sig Dispense Refill Last Dose  . BIOTIN PO Take 1 tablet by mouth daily.   Past Month at Unknown time   Musculoskeletal: Strength & Muscle Tone: within normal limits Gait & Station: normal Patient leans: N/A  Psychiatric Specialty Exam: Physical Exam  Constitutional: She is oriented to person, place, and time. She appears well-developed and well-nourished.  HENT:  Head: Normocephalic.  Eyes: Pupils are equal, round, and reactive to light.  Neck: Normal [place] of motion.  Cardiovascular: Normal rate.   Respiratory: Effort normal.  Genitourinary:  Denies any issues in this area  Musculoskeletal: Normal Hill[place] of motion.  Neurological: She is alert and oriented to person, place, and time.  Skin: Skin is warm and dry.  Psychiatric: Her speech is normal and behavior is normal. Judgment and thought content normal. Her mood appears anxious. Her affect is not angry, not blunt, not labile and not inappropriate. Cognition and memory are normal. She exhibits a depressed mood.    Review of Systems  Constitutional: Negative.   HENT: Negative.   Eyes: Negative.   Respiratory: Negative.   Cardiovascular: Negative.   Gastrointestinal: Negative.   Genitourinary: Negative.   Musculoskeletal: Negative.   Skin: Negative.   Neurological: Negative.   Endo/Heme/Allergies: Negative.   Psychiatric/Behavioral: Positive for depression and substance abuse (Cannabis dependence). Negative for suicidal ideas, hallucinations and memory loss. The patient is nervous/anxious and has insomnia.     Blood pressure 110/79, pulse 77, temperature 98.5 F (36.9 C), temperature source Oral, resp. rate 16, height '5\' 5"'$  (1.651 m), weight 74.39 kg (164 lb), not  currently breastfeeding.Body mass index is 27.29 kg/(m^2).  General Appearance: Fairly groomed   Engineer, water:: Good  Speech: Normal Rate  Volume: Normal  Mood: Depressed  Affect: constricted, but reactive   Thought Process: Linear  Orientation: Full (Time, Place, and Person)  Thought Content: denies hallucinations, no delusions   Suicidal Thoughts: No- denies any suicidal ideations at this time, able to contract for safety on the unit   Homicidal Thoughts: No denies any homicidal ideations   Memory: recent and remote grossly intact   Judgement: Fair  Insight: Fair  Psychomotor Activity: normal   Concentration: Good  Recall: Good  Fund of Knowledge:Good  Language: Good  Akathisia: Negative  Handed: Right  AIMS (if indicated):    Assets: Communication Skills Desire for Improvement Resilience  Sleep: Number of Hours: 4.75  Cognition: WNL  ADL's: Intact         Treatment Plan Summary: Daily contact with patient to assess and evaluate symptoms and progress in treatment and Medication management: 1. Admit for crisis management and stabilization, estimated length of stay 3-5 days.  2. Medication management to reduce current symptoms to base line and improve the patient's overall level of functioning:  Initiate Citalopram 10 mg for depression, Hydroxyzine 25 mg prn for anxiety, Trazodone 50 mg for insomnia, obtain a baseline Ekg. 3. Treat health problems as indicated.  4. Develop treatment plan to decrease risk of relapse upon discharge and the need for readmission.  5. Psycho-social education regarding relapse prevention and self care.  6. Health care follow up as needed for medical problems.  7. Review, reconcile, and reinstate any pertinent home medications for other health issues where appropriate. 8. Call for consults with hospitalist for any additional specialty patient care services as needed.  Observation Level/Precautions:   15 minute checks  Laboratory:  Per ED, UDS (+) for [facility]  Psychotherapy: Group sessions  Medications: Initiate Citalopram 10 mg for depression, Hydroxyzine 25 mg prn for anxiety, Trazodone 50 mg for insomnia.   Consultations: As needed  Discharge Concerns: Safety, mood stability    Estimated LOS: 3-5 days  Other: Admit to [id number]-NATF    I certify that inpatient services furnished can reasonably be expected to improve the patient's condition.    Encarnacion Slates, NP, PMHNP, FNP-BC 5/2/201711:40 AM Patient case reviewed with NP and patient seen by me Agree with NP note and assessment  Patient is a 25 year old single, employed female, lives alone . She reports worsening depression, sadness, and suicidal ideations/ruminations, leading to her coming to the ED . History of cannabis dependence, smokes essentially daily, no other drug or alcohol abuse . One contributing factor she identifies is upcoming anniversary of a miscarriage she had last year.  No prior formal psychiatric history, but states she has struggled with depression in the past, and feels she is highly sensitive to rejection , often feeling very depressed after she perceives she has been rejected by others. Cannabis abuse history , as above . Has not been on psychiatric medications in the past . Does not endorse medical illnesses .  Dx- MDD, Recurrent, no Psychotic Features, Cannabis Dependence Plan- Inpatient admission - patient agrees to antidepressant trial- start CELEXA 10 mgrs QDAY , VISTARIL PRNs for anxiety as needed

## 2015-06-22 NOTE — [facility] Suicide Risk Assessment (Signed)
[facility]   Nursing information obtained from:  Patient Demographic factors:  Adolescent or young adult, Living alone Current Mental Status:  NA Loss Factors:  Loss of significant relationship Historical Factors:  Prior suicide attempts, Anniversary of important loss Risk Reduction Factors:  Employed  Total Time spent with patient: 45 minutes Principal Problem:  MDD Diagnosis:   Patient Active Problem List   Diagnosis Date Noted  . Major depression ([facility]) [F32.9] 06/22/2015     Continued Clinical Symptoms:  Alcohol Use Disorder Identification Test Final Score (AUDIT): 1 The "Alcohol Use Disorders Identification Test", Guidelines for Use in Primary Care, Second Edition.  World Science writerHealth Organization [facility](WHO). Score between 0-7:  no or low risk or alcohol related problems. Score between 8-15:  moderate risk of alcohol related problems. Score between 16-19:  high risk of alcohol related problems. Score 20 or above:  warrants further diagnostic evaluation for alcohol dependence and treatment.   CLINICAL FACTORS:  Patient is a [age] year old single, employed female, lives alone . She reports worsening depression, sadness, and suicidal ideations/ruminations, leading to her coming to the ED . History of cannabis dependence, smokes essentially daily, no other drug or alcohol abuse . One contributing factor she identifies is upcoming anniversary of a miscarriage she had last year.  No prior formal psychiatric history, but states she has struggled with depression in the past, and feels she is highly sensitive to rejection , often feeling very depressed after she perceives she has been rejected by others. Cannabis abuse history , as above . Has not been on psychiatric medications in the past . Does not endorse medical illnesses .  Dx- MDD, Recurrent, no Psychotic Features, Cannabis Dependence Plan- Inpatient admission - patient agrees to antidepressant trial- start CELEXA  10 mgrs QDAY , VISTARIL PRNs for anxiety as needed       Musculoskeletal: Strength & Muscle Tone: within normal limits Gait & Station: normal Patient leans: N/A  Psychiatric Specialty Exam: ROS  Blood pressure 110/79, pulse 77, temperature 98.5 F (36.9 C), temperature source Oral, resp. rate 16, height 5\' 5"  (1.651 m), weight 164 lb (74.39 kg), not currently breastfeeding.Body mass index is 27.29 kg/(m^2).  General Appearance:  Fairly groomed   Patent attorneyye Contact::  Good  Speech:  Normal Rate  Volume:  Normal  Mood:  Depressed  Affect:  constricted, but reactive   Thought Process:  Linear  Orientation:  Full (Time, Place, and Person)  Thought Content:  denies hallucinations, no delusions   Suicidal Thoughts:  No- denies any suicidal ideations at this time, able to contract for safety on the unit   Homicidal Thoughts:  No denies any homicidal ideations   Memory:  recent and remote grossly intact   Judgement:  Fair  Insight:  Fair  Psychomotor Activity:  normal   Concentration:  Good  Recall:  Good  Fund of Knowledge:Good  Language: Good  Akathisia:  Negative  Handed:  Right  AIMS (if indicated):     Assets:  Communication Skills Desire for Improvement Resilience  Sleep:  Number of Hours: 4.75  Cognition: WNL  ADL's:  Intact    COGNITIVE FEATURES THAT CONTRIBUTE TO RISK:  Closed-mindedness and Loss of executive function    SUICIDE RISK:   Moderate:  Frequent suicidal ideation with limited intensity, and duration, some specificity in terms of plans, no associated intent, good self-control, limited dysphoria/symptomatology, some risk factors present, and identifiable protective factors, including available and accessible social support.  PLAN  OF CARE: Patient will be admitted to inpatient psychiatric unit for stabilization and safety. Will provide and encourage milieu participation. Provide medication management and maked adjustments as needed.  Will follow daily.    I  certify that inpatient services furnished can reasonably be expected to improve the patient's condition.   Nehemiah Massed, MD 06/22/2015, 1:22 PM

## 2015-06-22 NOTE — [facility] Group Notes (Signed)
[facility] LCSW Group Therapy  06/22/2015   1:15 PM   Type of Therapy:  Group Therapy  Participation Level:  Active  Participation Quality:  Attentive, Sharing and Supportive  Affect:  Depressed and Flat  Cognitive:  Alert and Oriented  Insight:  Developing/Improving and Engaged  Engagement in Therapy:  Developing/Improving and Engaged  Modes of Intervention:  Clarification, Confrontation, Discussion, Education, Exploration, Limit-setting, Orientation, Problem-solving, Rapport Building, Dance movement psychotherapisteality Testing, Socialization and Support  Summary of Progress/Problems: The topic for group therapy was feelings about diagnosis.  Pt actively participated in group discussion on their past and current diagnosis and how they feel towards this.  Pt also identified how society and family members judge them, based on their diagnosis as well as stereotypes and stigmas.  Patient discussed the stigma of mental illness in the African American community. CSW and other group members provided patient with emotional support and encouragement.   Samuella BruinKristin Diarra Ceja, MSW, LCSW Clinical Social Worker [facility] [phone]

## 2015-06-23 DIAGNOSIS — F332 Major depressive disorder, recurrent severe without psychotic features: Secondary | ICD-10-CM | POA: Insufficient documentation

## 2015-06-23 MED ORDER — CITALOPRAM HYDROBROMIDE 20 MG PO TABS
20.0000 mg | ORAL_TABLET | Freq: Every day | ORAL | Status: DC
  Administered 2015-06-24 – 2015-06-25 (×2): 20 mg via ORAL
  Filled 2015-06-23 (×4): qty 1
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
## 2015-06-23 NOTE — Plan of Care (Signed)
Problem: Ineffective individual coping Goal: STG:Pt. will utilize relaxation techniques to reduce stress STG: Patient will utilize relaxation techniques to reduce stress levels  Outcome: Progressing During stressful time this morning, pt utilized her coping skills by reading a book and talking to roommate.

## 2015-06-23 NOTE — Progress Notes (Signed)
[facility] MD Progress Note  06/23/2015 3:24 PM Carol Nichols  MRN:  [id number] Subjective:  Reports some improvement , but describes some ongoing depression, sadness . States " this morning was kind of tough, but I am feeling better this afternoon". States she is feeling more optimistic, and feels the medication is going to help. Denies medication side effects at this time . Denies any current suicidal ideations. Remains ruminative about her miscarriage last year, states she feels guilty because she feels she miscarried because of how stressed she felt back then.  Objective : I have discussed case with treatment team and have met with patient . Patient continues to present with some depression, but states overall she feels better, more optimistic and affect is more reactive . Denies SI.  No disruptive or agitated behaviors on unit, going to groups, visible in milieu, interacting appropriately with peers . At this time does not endorse medication side effects . Reports some insomnia, because " I think a lot when I go to bed "  Responds well to support, encouragement , empathy, affect improves during session .  Principal Problem: Major depression ([place]) Diagnosis:   Patient Active Problem List   Diagnosis Date Noted  . Major depression ([place]) [F32.9] 06/22/2015   Total Time spent with patient: 25 minutes    Past Medical History: History reviewed. No pertinent past medical history.  Past Surgical History  Procedure Laterality Date  . Mouth surgery     Family History:  Family History  Problem Relation Age of Onset  . Hypertension Mother     Social History:  History  Alcohol Use No    Comment: socially     History  Drug Use  . 7.00 per week  . Special: Marijuana    Social History   Social History  . Marital Status: Single    Spouse Name: N/A  . Number of Children: N/A  . Years of Education: N/A   Social History Main Topics  . Smoking status: Current Some Day Smoker -- 0.25  packs/day    Types: Cigarettes    Last Attempt to Quit: 07/15/2014  . Smokeless tobacco: Never Used  . Alcohol Use: No     Comment: socially  . Drug Use: 7.00 per week    Special: Marijuana  . Sexual Activity: Yes    Birth Control/ Protection: None   Other Topics Concern  . None   Social History Narrative   Additional Social History:    Pain Medications: denies Prescriptions: denies Over the Counter: denies History of alcohol / drug use?: Yes Name of Substance 1: THC  1 - Age of First Use: 12 1 - Amount (size/oz): 3 blunts 1 - Frequency: daily  1 - Duration: 7 years  1 - Last Use / Amount: 06-21-15  Sleep: Good  Appetite:  Good  Current Medications: Current Facility-Administered Medications  Medication Dose Route Frequency Provider Last Rate Last Dose  . acetaminophen (TYLENOL) tablet 650 mg  650 mg Oral Q6H PRN Laverle Hobby, PA-C      . alum & mag hydroxide-simeth (MAALOX/MYLANTA) 200-200-20 MG/5ML suspension 30 mL  30 mL Oral Q4H PRN Laverle Hobby, PA-C      . citalopram (CELEXA) tablet 10 mg  10 mg Oral Daily Encarnacion Slates, NP   10 mg at 06/23/15 0815  . feeding supplement (ENSURE ENLIVE) (ENSURE ENLIVE) liquid 237 mL  237 mL Oral BID BM Jenne Campus, MD   237 mL at 06/23/15 0838  .  hydrOXYzine (ATARAX/VISTARIL) tablet 25 mg  25 mg Oral Q4H PRN Encarnacion Slates, NP   25 mg at 06/23/15 3009  . magnesium hydroxide (MILK OF MAGNESIA) suspension 30 mL  30 mL Oral Daily PRN Laverle Hobby, PA-C      . nicotine polacrilex (NICORETTE) gum 2 mg  2 mg Oral PRN Jenne Campus, MD   2 mg at 06/22/15 2330  . traZODone (DESYREL) tablet 50 mg  50 mg Oral QHS PRN,MR X 1 Laverle Hobby, PA-C   50 mg at 06/22/15 2141    Lab Results:  Results for orders placed or performed during the hospital encounter of 06/21/15 (from the past 48 hour(s))  Comprehensive metabolic panel     Status: Abnormal   Collection Time: 06/21/15  6:22 PM  Result Value Ref [place]   Sodium 139 135 -  145 mmol/L   Potassium 4.5 3.5 - 5.1 mmol/L   Chloride 107 101 - 111 mmol/L   CO2 26 22 - 32 mmol/L   Glucose, Bld 86 65 - 99 mg/dL   BUN 7 6 - 20 mg/dL   Creatinine, Ser 0.76 0.44 - 1.00 mg/dL   Calcium 9.3 8.9 - 10.3 mg/dL   Total Protein 7.6 6.5 - 8.1 g/dL   Albumin 4.5 3.5 - 5.0 g/dL   AST 19 15 - 41 U/L   ALT 13 (L) 14 - 54 U/L   Alkaline Phosphatase 50 38 - 126 U/L   Total Bilirubin 0.8 0.3 - 1.2 mg/dL   GFR calc non Af Amer >60 >60 mL/min   GFR calc Af Amer >60 >60 mL/min    Comment: (NOTE) The eGFR has been calculated using the CKD EPI equation. This calculation has not been validated in all clinical situations. eGFR's persistently <60 mL/min signify possible Chronic Kidney Disease.    Anion gap 6 5 - 15  Ethanol     Status: None   Collection Time: 06/21/15  6:22 PM  Result Value Ref [place]   Alcohol, Ethyl (B) <5 <5 mg/dL    Comment:        LOWEST DETECTABLE LIMIT FOR SERUM ALCOHOL IS 5 mg/dL FOR MEDICAL PURPOSES ONLY   Salicylate level     Status: None   Collection Time: 06/21/15  6:22 PM  Result Value Ref [place]   Salicylate Lvl <0.7 2.8 - 30.0 mg/dL  Acetaminophen level     Status: Abnormal   Collection Time: 06/21/15  6:22 PM  Result Value Ref [place]   Acetaminophen (Tylenol), Serum <10 (L) 10 - 30 ug/mL    Comment:        THERAPEUTIC CONCENTRATIONS VARY SIGNIFICANTLY. A [place] OF 10-30 ug/mL MAY BE AN EFFECTIVE CONCENTRATION FOR MANY PATIENTS. HOWEVER, SOME ARE BEST TREATED AT CONCENTRATIONS OUTSIDE THIS [place]. ACETAMINOPHEN CONCENTRATIONS >150 ug/mL AT 4 HOURS AFTER INGESTION AND >50 ug/mL AT 12 HOURS AFTER INGESTION ARE OFTEN ASSOCIATED WITH TOXIC REACTIONS.   cbc     Status: None   Collection Time: 06/21/15  6:22 PM  Result Value Ref [place]   WBC 5.3 4.0 - 10.5 K/uL   RBC 4.81 3.87 - 5.11 MIL/uL   Hemoglobin 14.5 12.0 - 15.0 g/dL   HCT 41.8 36.0 - 46.0 %   MCV 86.9 78.0 - 100.0 fL   MCH 30.1 26.0 - 34.0 pg   MCHC 34.7 30.0 - 36.0 g/dL   RDW  12.6 11.5 - 15.5 %   Platelets 248 150 - 400 K/uL  I-Stat beta  hCG blood, ED     Status: None   Collection Time: 06/21/15  6:28 PM  Result Value Ref [place]   I-stat hCG, quantitative <5.0 <5 mIU/mL   Comment 3            Comment:   GEST. AGE      CONC.  (mIU/mL)   <=1 WEEK        5 - 50     2 WEEKS       50 - 500     3 WEEKS       100 - 10,000     4 WEEKS     1,000 - 30,000        FEMALE AND NON-PREGNANT FEMALE:     LESS THAN 5 mIU/mL   Rapid urine drug screen (hospital performed)     Status: Abnormal   Collection Time: 06/21/15  7:40 PM  Result Value Ref [place]   Opiates NONE DETECTED NONE DETECTED   Cocaine NONE DETECTED NONE DETECTED   Benzodiazepines NONE DETECTED NONE DETECTED   Amphetamines NONE DETECTED NONE DETECTED   Tetrahydrocannabinol POSITIVE (A) NONE DETECTED   Barbiturates NONE DETECTED NONE DETECTED    Comment:        DRUG SCREEN FOR MEDICAL PURPOSES ONLY.  IF CONFIRMATION IS NEEDED FOR ANY PURPOSE, NOTIFY LAB WITHIN 5 DAYS.        LOWEST DETECTABLE LIMITS FOR URINE DRUG SCREEN Drug Class       Cutoff (ng/mL) Amphetamine      1000 Barbiturate      200 Benzodiazepine   200 Tricyclics       300 Opiates          300 Cocaine          300 THC              50     Blood Alcohol level:  Lab Results  Component Value Date   ETH <5 06/21/2015    Physical Findings: AIMS: Facial and Oral Movements Muscles of Facial Expression: None, normal Lips and Perioral Area: None, normal Jaw: None, normal Tongue: None, normal,Extremity Movements Upper (arms, wrists, hands, fingers): None, normal Lower (legs, knees, ankles, toes): None, normal, Trunk Movements Neck, shoulders, hips: None, normal, Overall Severity Severity of abnormal movements (highest score from questions above): None, normal Incapacitation due to abnormal movements: None, normal Patient's awareness of abnormal movements (rate only patient's report): No Awareness, Dental Status Current problems with  teeth and/or dentures?: No Does patient usually wear dentures?: No  CIWA:  CIWA-Ar Total: 1 COWS:  COWS Total Score: 1  Musculoskeletal: Strength & Muscle Tone: within normal limits Gait & Station: normal Patient leans: N/A  Psychiatric Specialty Exam: ROS denies headache, no chest pain, no shortness of breath, describes some nausea, but no vomiting, no rash   Blood pressure 127/77, pulse 71, temperature 98.4 F (36.9 C), temperature source Oral, resp. rate 16, height 5\' 5"  (1.651 m), weight 164 lb (74.39 kg), not currently breastfeeding.Body mass index is 27.29 kg/(m^2).  General Appearance: improved grooming   Eye Contact::  Good  Speech:  Normal Rate  Volume:  Normal  Mood:  reports she is feeling better, less depressed   Affect:  more reactive, smiles at times appropriately  Thought Process:  Linear  Orientation:  Full (Time, Place, and Person)  Thought Content:  denies hallucinations, no delusions , not internally preoccupied   Suicidal Thoughts:  denies suicidal plan or intention at this time,contracts  for safety  Homicidal Thoughts:  No- denies any homicidal ideations at this time   Memory:  recent and remote grossly intact   Judgement:  Other:  improving   Insight:  improving   Psychomotor Activity:  Normal  Concentration:  Good  Recall:  Good  Fund of Knowledge:Good  Language: Good  Akathisia:  Negative  Handed:  Right  AIMS (if indicated):     Assets:  Communication Skills Desire for Improvement Resilience  ADL's:  Intact  Cognition: WNL  Sleep:  Number of Hours: 6.25  Assessment - patient remains depressed, sad, but improved compared to admission and reports partial improvement /feeling more optimistic.  Denies SI at this time. Currently tolerating medications well ( Celexa )  Treatment Plan Summary: Daily contact with patient to assess and evaluate symptoms and progress in treatment, Medication management, Plan inpatient admission  and medications as below   Encourage ongoing group, milieu participation to work on coping skills and symptom reduction Increase Celexa to 20  mgrs QDAY for depression and anxiety Continue Vistaril 25 mgrs Q 6 hours PRN for anxiety as needed  Continue Trazodone 50 mgrs QHS PRN for insomnia as needed  Treatment team working on disposition planning  Neita Garnet, MD 06/23/2015, 3:24 PM

## 2015-06-23 NOTE — [facility] Group Notes (Signed)
   [facility] Note  06/23/2015  8:45 AM   Participation Quality: Alert, Appropriate and Oriented  Mood/Affect: Blunted  Depression Rating: 5-6  Anxiety Rating: 4-5  Thoughts of Suicide: Pt denies SI/HI  Will you contract for safety? Yes  Current AVH: Pt denies  Plan for Discharge/Comments: Pt attended discharge planning group and actively participated in group. CSW provided pt with today's workbook. Patient complaining of crying spells and racing thoughts today. She plans to return to home to follow up with outpatient services.   Transportation Means: Pt reports access to transportation  Supports: No supports mentioned at this time  Samuella BruinKristin Abisola Carrero, MSW, Johnson & JohnsonLCSW Clinical Social Worker [facility] [phone]

## 2015-06-23 NOTE — Progress Notes (Signed)
Adult Psychoeducational Group Note  Date:  06/23/2015 Time:  9:40 PM  Group Topic/Focus:  Wrap-Up Group:   The focus of this group is to help patients review their daily goal of treatment and discuss progress on daily workbooks.  Participation Level:  Active  Participation Quality:  Appropriate  Affect:  Appropriate  Cognitive:  Alert  Insight: Appropriate  Engagement in Group:  Engaged  Modes of Intervention:  Discussion  Additional Comments:  Pt rated her day 9/10. She enjoyed her visit with mom. Pt also participated in a group activity.   Kaleen OdeaCOOKE, Isabell Bonafede R 06/23/2015, 9:40 PM

## 2015-06-23 NOTE — Progress Notes (Signed)
D: Pt presents with sad affect and depressed mood. Pt rates depression 6/10. Anxiety 5/10. Pt reports racing thoughts, crying spells and increase depression today. Pt requested prn vistaril to help her cope this morning. Pt observed reading her book, engaging with roommate and attending groups. Pt utilized her coping skills before asking for prn med.  A: Medications reviewed with pt. Medications administered as ordered per MD. Verbal support provided. Pt encouraged to attend groups. 15 minute checks performed for safety. R: Pt stated goal "my goal today is to find a way to keep my happiness". Pt receptive to tx. Pt verbalized understanding of med regimen.

## 2015-06-23 NOTE — Progress Notes (Signed)
Recreation Therapy Notes  Date: 05.03.2017 Time: 9:30am Location: [address] Dayroom   Group Topic: Stress Management  Goal Area(s) Addresses:  Patient will actively participate in stress management techniques presented during session.   Behavioral Response: Engaged, Attentive  Intervention: Stress management techniques  Activity :  Deep Breathing and Progressive Body Relaxation. LRT provided education, instruction and demonstration on practice of Deep Breathing and Progressive Body Relaxation. Patient was asked to participate in technique introduced during session.   Education:  Stress Management, Discharge Planning.   Education Outcome: Acknowledges education  Clinical Observations/Feedback: Patient actively engaged in technique introduced, expressed no concerns and demonstrated ability to practice independently post d/c.   Carol Nichols L Carol Nichols, LRT/CTRS        Carol Nichols L 06/23/2015 1:37 PM 
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
## 2015-06-24 NOTE — Progress Notes (Signed)
D:Pt is appropriate this morning interacting with staff and peers. Pt reports that she wants to work on coping skills for anxiety along with thinking positive.  A:Offered support, encouragement and 15 minute checks. R:Pt denies si and hi. Safety maintained on the unit.

## 2015-06-24 NOTE — Progress Notes (Signed)
Patient ID: Carol Nichols, female   DOB: 07-15-1990, 25 y.o.   MRN: [id number] D: Client is animated, laughing, interacting with peers, dancing on the hall. Client reports she has gained som positive coping skills. "medications good balance" Client focus on deep breathing, progressive muscle relaxation, an stepping away from a situation as coping mechanisms. A: Writer provided emotional support talking to client encouraging use of coping skills upon discharge, practicing say no to situations and people that compromise her emotional stability. Medications reviewed, administered as ordered. Staff will monitor q3015min for safety. R: Client is safe on the unit, attended group.

## 2015-06-24 NOTE — Progress Notes (Signed)
[facility] Group Notes:  (Nursing/MHT/Case Management/Adjunct)  Date:  06/24/2015  Time:  9:36 AM  Type of Therapy:  Nurse Education  Participation Level:  Active  Participation Quality:  Appropriate and Sharing  Affect:  Appropriate  Cognitive:  Alert and Oriented  Insight:  Appropriate  Engagement in Group:  Engaged  Modes of Intervention:  Discussion, Education, Socialization and Support  Summary of Progress/Problems:Pt is working on her self esteem loving herself and thinking positive. The purpose of this group is to identify things to work on and set a goal for today.  Beatrix ShipperWright, Makinzee Durley Martin 06/24/2015, 9:36 AM

## 2015-06-24 NOTE — [facility] Group Notes (Signed)
Late Entry from 06/23/15:   [facility] 06/23/15  1:15 PM Type of Therapy: Group Therapy Participation Level: Active  Participation Quality: Attentive, Sharing and Supportive  Affect: Appropriate, tearful  Cognitive: Alert and Oriented  Insight: Developing/Improving and Engaged  Engagement in Therapy: Developing/Improving and Engaged  Modes of Intervention: Clarification, Confrontation, Discussion, Education, Exploration, Limit-setting, Orientation, Problem-solving, Rapport Building, Dance movement psychotherapisteality Testing, Socialization and Support  Summary of Progress/Problems: The topic for group today was emotional regulation. This group focused on both positive and negative emotion identification and allowed group members to process ways to identify feelings, regulate negative emotions, and find healthy ways to manage internal/external emotions. Group members were asked to reflect on a time when their reaction to an emotion led to a negative outcome and explored how alternative responses using emotion regulation would have benefited them. Group members were also asked to discuss a time when emotion regulation was utilized when a negative emotion was experienced. Patient discussed her desire for unconditional love and shared about her grief related to her miscarriage last year. She became tearful during discussion and discussed her negative self-view and desire for validation from others. She identifies her mother as supportive.   Carol BruinKristin Aldridge Nichols, MSW, LCSW Clinical Social Worker [facility] [phone]

## 2015-06-24 NOTE — [facility] Group Notes (Signed)
[facility] LCSW Group Therapy 06/24/2015 1:15 PM Type of Therapy: Group Therapy Participation Level: Active  Participation Quality: Attentive, Sharing and Supportive  Affect: Appropriate  Cognitive: Alert and Oriented  Insight: Developing/Improving and Engaged  Engagement in Therapy: Developing/Improving and Engaged  Modes of Intervention: Activity, Clarification, Confrontation, Discussion, Education, Exploration, Limit-setting, Orientation, Problem-solving, Rapport Building, Reality Testing, Socialization and Support  Summary of Progress/Problems: Patient was attentive and engaged with speaker from [employer]. Patient was attentive to speaker while they shared their story of dealing with mental health and overcoming it. Patient expressed interest in their programs and services and received information on their agency. Patient processed ways they can relate to the speaker.   Eun Vermeer, LCSW Clinical Social Worker [facility] [phone]   

## 2015-06-24 NOTE — Progress Notes (Signed)
Adult Psychoeducational Group Note  Date:  06/24/2015 Time:  10:09 PM  Group Topic/Focus:  Wrap-Up Group:   The focus of this group is to help patients review their daily goal of treatment and discuss progress on daily workbooks.  Participation Level:  Active  Participation Quality:  Appropriate  Affect:  Appropriate  Cognitive:  Alert  Insight: Appropriate  Engagement in Group:  Engaged  Modes of Intervention:  Discussion  Additional Comments:  Patient goal for today was "to stay positive". On a scale between 1-10, (1=worse, 10=best) patient rated her day a 10.  Asheton Viramontes L Eryc Bodey 06/24/2015, 10:09 PM

## 2015-06-25 MED ORDER — TRAZODONE HCL 50 MG PO TABS: 50.0000 mg | ORAL_TABLET | Freq: Every evening | ORAL | Status: DC | PRN

## 2015-06-25 MED ORDER — HYDROXYZINE HCL 25 MG PO TABS: 25.0000 mg | ORAL_TABLET | ORAL | Status: DC | PRN

## 2015-06-25 MED ORDER — CITALOPRAM HYDROBROMIDE 20 MG PO TABS: 20.0000 mg | ORAL_TABLET | Freq: Every day | ORAL | Status: DC

## 2015-06-25 MED ORDER — NICOTINE POLACRILEX 2 MG MT GUM: 2.0000 mg | CHEWING_GUM | OROMUCOSAL | Status: DC | PRN

## 2015-06-25 NOTE — Plan of Care (Signed)
Problem: Alteration in mood; excessive anxiety as evidenced by: Goal: STG-Pt can identify coping skills to manage panic/anxiety (Patient can identify at least ____ coping skills to manage panic/anxiety attack)  Outcome: Progressing Client identifies 3 coping skills to help manage anxiety 1.) "do my deep breathing" 2.) "the muscle relaxation thing" 3.) "writing about things"

## 2015-06-25 NOTE — Progress Notes (Signed)
Recreation Therapy Notes  Date: 05.05.2017 Time: 9:30am Location: [address] Dayroom   Group Topic: Stress Management  Goal Area(s) Addresses:  Patient will actively participate in stress management techniques presented during session.   Behavioral Response: Appropriate, Engaged   Intervention: Stress management techniques  Activity :  Guided Imagery. Patient listened to audio recording of guided imagery script for stress relief and increased self-esteem.   Education:  Stress Management, Discharge Planning.   Education Outcome: Acknowledges education  Clinical Observations/Feedback: Patient actively engaged in technique introduced, expressed no concerns and demonstrated ability to practice independently post d/c.   Alesha Jaffee L Phylis Javed, LRT/CTRS        Jeffrie Lofstrom L 06/25/2015 10:05 AM 
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
## 2015-06-25 NOTE — Progress Notes (Signed)
  [facility] :  Will you be returning to the same living situation after discharge:  Yes,  patient plans to return home At discharge, do you have transportation home?: Yes,  patient reports access to transportation home Do you have the ability to pay for your medications: Yes, patient will be provided with prescriptions at discharge   Release of information consent forms completed and in the chart;  Patient's signature needed at discharge.  Patient to Follow up at: Follow-up Information    Follow up with [facility] .   Why:  Referral sent 5/4. Staff member will contact you within 2-3 business days of discharge to schedule appointments for therapy and medication management services.    Contact information:   [address] [place], [place] [address] Phone: [phone]      Next level of care provider has access to [place] [facility]: no  Safety Planning and Suicide Prevention discussed: Yes,  with patient and mother  Have you used any form of tobacco in the last 30 days? (Cigarettes, Smokeless Tobacco, Cigars, and/or Pipes): Yes  Has patient been referred to the Quitline?: Patient refused referral  Patient has been referred for addiction treatment: Yes  Idalia Allbritton, [place] CarboKristin L 06/25/2015, 12:38 PM

## 2015-06-25 NOTE — [facility] Suicide Risk Assessment (Signed)
[facility]   Principal Problem: Major depression [facility](HCC) Discharge Diagnoses:  Patient Active Problem List   Diagnosis Date Noted  . Severe episode of recurrent major depressive disorder, without psychotic features (HCC) [F33.2]   . Major depression (HCC) [F32.9] 06/22/2015    Total Time spent with patient: 30 minutes  Musculoskeletal: Strength & Muscle Tone: within normal limits Gait & Station: normal Patient leans: N/A  Psychiatric Specialty Exam: ROS no headache, no chest pain, no shortness of breath, no nausea, no vomiting, no rash  Blood pressure 130/70, pulse 88, temperature 98.5 F (36.9 C), temperature source Oral, resp. rate 16, height 5\' 5"  (1.651 m), weight 164 lb (74.39 kg), not currently breastfeeding.Body mass index is 27.29 kg/(m^2).  General Appearance: Well Groomed  Eye Contact::  Good  Speech:  Normal Rate409  Volume:  Normal  Mood:  improved and at this time does not appear internally preoccupied   Affect:  Appropriate and Full [place]  Thought Process:  Linear  Orientation:  Full (Time, Place, and Person)  Thought Content:  denies hallucinatins, no delusions, not internally preoccupied   Suicidal Thoughts:  No- denies any suicidal ideations,  Denies any self injurious ideations, denies any homicidal ideations,   Homicidal Thoughts:  No  Memory:  recent and remote grossly intact   Judgement:  Other:  improved  Insight:  improved  Psychomotor Activity:  Normal  Concentration:  Good  Recall:  Good  Fund of Knowledge:Good  Language: Good  Akathisia:  Negative  Handed:  Right  AIMS (if indicated):     Assets:  Desire for Improvement Resilience  Sleep:  Number of Hours: 6.75  Cognition: WNL  ADL's:  Intact   Mental Status Per Nursing Assessment::   On Admission:  NA  Demographic Factors:  25 year old female, employed  Loss Factors: Grief related to miscarriage which occurred last year  Historical Factors: History of  depression, history of a suicide attempt by overdosing as a teenager  Risk Reduction Factors:   Sense of responsibility to family and Positive coping skills or problem solving skills  Continued Clinical Symptoms:  At this time patient is alert, attentive, well related, mood is improved and affect is fuller in [place], brighter, no thought disorder, no SI or HI, no psychotic symptoms, future oriented, looking forward to returning home and returning to work soon  Cognitive Features That Contribute To Risk:  No gross cognitive deficits noted upon discharge. Is alert , attentive, and oriented x 3   Suicide Risk:  Mild:  Suicidal ideation of limited frequency, intensity, duration, and specificity.  There are no identifiable plans, no associated intent, mild dysphoria and related symptoms, good self-control (both objective and subjective assessment), few other risk factors, and identifiable protective factors, including available and accessible social support.  Follow-up Information    Follow up with [facility] .   Why:  Referral sent 5/4. Staff member will contact you within 2-3 business days of discharge to schedule appointments for therapy and medication management services.    Contact information:   [address] [place], [place] [address] Phone: [phone]      Plan Of Care/Follow-up recommendations:  Activity:  as tolerated  Diet:  Regular  Tests:  NA Other:  See below  Patient is leaving unit in good spirits Plans to return home Follow up as above   Nehemiah MassedOBOS, FERNANDO, MD 06/25/2015, 1:14 PM

## 2015-06-25 NOTE — [facility] Group Notes (Signed)
   [facility]  06/25/2015  8:45 AM   Participation Quality: Alert, Appropriate and Oriented  Mood/Affect: Appropriate  Depression Rating: 0  Anxiety Rating: 8-9  Thoughts of Suicide: Pt denies SI/HI  Will you contract for safety? Yes  Current AVH: Pt denies  Plan for Discharge/Comments: Pt attended discharge planning group and actively participated in group. CSW provided pt with today's workbook. Patient plans to return home to follow up with outpatient services.   Transportation Means: Pt reports access to transportation  Supports: No supports mentioned at this time  Samuella BruinKristin Jacinta Penalver, MSW, Johnson [employer] JohnsonLCSW Clinical Social Worker [facility] [phone]

## 2015-06-25 NOTE — Discharge Summary (Signed)
Physician Discharge Summary Note  Patient:  Carol Nichols is an 25 y.o., female MRN:  [id number] DOB:  [birth year] Patient phone:  [phone] (home)  Patient address:   [address] [place] [address],  Total Time spent with patient: 30 minutes  Date of Admission:  06/21/2015 Date of Discharge: 06/25/2015  Reason for Admission:  Worsening depression  Principal Problem: Major depression [facility]) Discharge Diagnoses: Patient Active Problem List   Diagnosis Date Noted  . Severe episode of recurrent major depressive disorder, without psychotic features (HCC) [F33.2]   . Major depression (HCC) [F32.9] 06/22/2015    Past Psychiatric History:  See above noted  Past Medical History: History reviewed. No pertinent past medical history.  Past Surgical History  Procedure Laterality Date  . Mouth surgery     Family History:  Family History  Problem Relation Age of Onset  . Hypertension Mother    Family Psychiatric  History:  See above noted Social History:  History  Alcohol Use No    Comment: socially     History  Drug Use  . 7.00 per week  . Special: Marijuana    Social History   Social History  . Marital Status: Single    Spouse Name: N/A  . Number of Children: N/A  . Years of Education: N/A   Social History Main Topics  . Smoking status: Current Some Day Smoker -- 0.25 packs/day    Types: Cigarettes    Last Attempt to Quit: 07/15/2014  . Smokeless tobacco: Never Used  . Alcohol Use: No     Comment: socially  . Drug Use: 7.00 per week    Special: Marijuana  . Sexual Activity: Yes    Birth Control/ Protection: None   Other Topics Concern  . None   Social History Narrative    Hospital Course:  Davanee Klinkner, 25 yrs old was admitted to [facility] from the [facility] ED, her  first psychiatric admission in this hospital.  with complaints of suicidal ideations & worsening symptoms of depression.  She was seeking mood stabilization treatments.     Anapaula Guiles was admitted for Major depression (HCC) and crisis management.  She was treated with medications listed below.  Medical problems were identified and treated as needed.  Home medications were restarted as appropriate.  Improvement was monitored by observation and Maysoon Stillson daily report of symptom reduction.  Emotional and mental status was monitored by daily self inventory reports completed by [facility] and clinical staff.  Patient reported continued improvement, denied any new concerns.  Patient had been compliant on medications and denied side effects.  Support and encouragement was provided.    Patient did well during inpatient stay.  At time of discharge, patient rated both depression and anxiety levels to be manageable and minimal.  Patient was able to identify the triggers of emotional crises and de-stabilizations.  Patient identified the positive things in life that would help in dealing with feelings of loss, depression and unhealthy or abusive tendencies.         Torin Litzinger was evaluated by the treatment team for stability and plans for continued recovery upon discharge.  She was offered further treatment options upon discharge including Residential, Intensive Outpatient and Outpatient treatment.  She will follow up with agencies listed below for medication management and counseling.  Encouraged patient to maintain satisfactory support network and home environment.  Advised to adhere to medication compliance and outpatient treatment follow up.  Massa Quiles motivation was an integral factor for scheduling further treatment.  Employment, transportation, bed availability, health status, family support, and any pending legal issues were also considered during her hospital stay.  Upon completion of this admission the patient was both mentally and medically stable for discharge denying suicidal/homicidal ideation, auditory/visual/tactile hallucinations, delusional  thoughts and paranoia.      Physical Findings: AIMS: Facial and Oral Movements Muscles of Facial Expression: None, normal Lips and Perioral Area: None, normal Jaw: None, normal Tongue: None, normal,Extremity Movements Upper (arms, wrists, hands, fingers): None, normal Lower (legs, knees, ankles, toes): None, normal, Trunk Movements Neck, shoulders, hips: None, normal, Overall Severity Severity of abnormal movements (highest score from questions above): None, normal Incapacitation due to abnormal movements: None, normal Patient's awareness of abnormal movements (rate only patient's report): No Awareness, Dental Status Current problems with teeth and/or dentures?: No Does patient usually wear dentures?: No  CIWA:  CIWA-Ar Total: 1 COWS:  COWS Total Score: 1  Musculoskeletal: Strength & Muscle Tone: within normal limits Gait & Station: normal Patient leans: N/A  Psychiatric Specialty Exam: Review of Systems  Psychiatric/Behavioral: Negative for suicidal ideas and hallucinations.  All other systems reviewed and are negative.   Blood pressure 130/70, pulse 88, temperature 98.5 F (36.9 C), temperature source Oral, resp. rate 16, height 5\' 5"  (1.651 m), weight 74.39 kg (164 lb), not currently breastfeeding.Body mass index is 27.29 kg/(m^2).  Have you used any form of tobacco in the last 30 days? (Cigarettes, Smokeless Tobacco, Cigars, and/or Pipes): Yes  Has this patient used any form of tobacco in the last 30 days? (Cigarettes, Smokeless Tobacco, Cigars, and/or Pipes) Yes, N/A  Blood Alcohol level:  Lab Results  Component Value Date   ETH <5 06/21/2015    Metabolic Disorder Labs:  No results found for: HGBA1C, MPG No results found for: PROLACTIN No results found for: CHOL, TRIG, HDL, CHOLHDL, VLDL, LDLCALC  See Psychiatric Specialty Exam and Suicide Risk Assessment completed by Attending Physician prior to discharge.  Discharge destination:  Home  Is patient on multiple  antipsychotic therapies at discharge:  No   Has Patient had three or more failed trials of antipsychotic monotherapy by history:  No  Recommended Plan for Multiple Antipsychotic Therapies: NA  Discharge Instructions    Diet - low sodium heart healthy    Complete by:  As directed      Increase activity slowly    Complete by:  As directed             Medication List    STOP taking these medications        BIOTIN PO      TAKE these medications      Indication   citalopram 20 MG tablet  Commonly known as:  CELEXA  Take 1 tablet (20 mg total) by mouth daily.   Indication:  Depression     hydrOXYzine 25 MG tablet  Commonly known as:  ATARAX/VISTARIL  Take 1 tablet (25 mg total) by mouth every 4 (four) hours as needed for anxiety (Sleep).   Indication:  Anxiety     nicotine polacrilex 2 MG gum  Commonly known as:  NICORETTE  Take 1 each (2 mg total) by mouth as needed for smoking cessation.   Indication:  Nicotine Addiction     traZODone 50 MG tablet  Commonly known as:  DESYREL  Take 1 tablet (50 mg total) by mouth at bedtime as needed and may repeat dose one time if  needed for sleep.   Indication:  Major Depressive Disorder           Follow-up Information    Follow up with [facility] .   Why:  Referral sent 5/4. Staff member will contact you within 2-3 business days of discharge to schedule appointments for therapy and medication management services.    Contact information:   [address] [place], [place] [address] Phone: [phone]      Follow-up recommendations:  Activity:  as tol Diet:  as tol  Comments:  1.  Take all your medications as prescribed.   2.  Report any adverse side effects to outpatient provider. 3.  Patient instructed to not use alcohol or illegal drugs while on prescription medicines. 4.  In the event of worsening symptoms, instructed patient to call 911, the crisis hotline or go to nearest emergency room for  evaluation of symptoms.  Signed: Lindwood Qua, NP [facility] 06/25/2015, 3:07 PM  Patient seen, Suicide Assessment Completed.  Disposition Plan Reviewed

## 2015-06-25 NOTE — [facility] Suicide Risk Assessment (Signed)
[facility] INPATIENT:  Family/Significant Other Suicide Prevention Education  Suicide Prevention Education:  Education Completed; mother Marcelle SmilingRhonda Davis [phone],  (name of family member/significant other) has been identified by the patient as the family member/significant other with whom the patient will be residing, and identified as the person(s) who will aid the patient in the event of a mental health crisis (suicidal ideations/suicide attempt).  With written consent from the patient, the family member/significant other has been provided the following suicide prevention education, prior to the and/or following the discharge of the patient.  The suicide prevention education provided includes the following:  Suicide risk factors  Suicide prevention and interventions  National Suicide Hotline telephone number  [facility] assessment telephone number  [facility] Emergency Assistance 911  [facility] and/or Residential Mobile Crisis Unit telephone number  Request made of family/significant other to:  Remove weapons (e.g., guns, rifles, knives), all items previously/currently identified as safety concern.    Remove drugs/medications (over-the-counter, prescriptions, illicit drugs), all items previously/currently identified as a safety concern.  The family member/significant other verbalizes understanding of the suicide prevention education information provided.  The family member/significant other agrees to remove the items of safety concern listed above.  Reena Borromeo, [place] CarboKristin L 06/25/2015, 12:32 PM
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
## 2015-07-26 ENCOUNTER — Encounter (HOSPITAL_COMMUNITY): Payer: Self-pay | Admitting: *Deleted

## 2015-07-26 ENCOUNTER — Inpatient Hospital Stay (HOSPITAL_COMMUNITY)
Admission: AD | Admit: 2015-07-26 | Discharge: 2015-07-26 | Disposition: A | Discharge status: Home / Self Care | Payer: Self-pay | Source: Ambulatory Visit | Attending: Obstetrics & Gynecology | Admitting: Obstetrics & Gynecology

## 2015-07-26 ENCOUNTER — Inpatient Hospital Stay (HOSPITAL_COMMUNITY): Payer: Self-pay

## 2015-07-26 DIAGNOSIS — N760 Acute vaginitis: Secondary | ICD-10-CM

## 2015-07-26 DIAGNOSIS — Z3A01 Less than 8 weeks gestation of pregnancy: Secondary | ICD-10-CM | POA: Insufficient documentation

## 2015-07-26 DIAGNOSIS — R109 Unspecified abdominal pain: Secondary | ICD-10-CM | POA: Insufficient documentation

## 2015-07-26 DIAGNOSIS — Z87891 Personal history of nicotine dependence: Secondary | ICD-10-CM | POA: Insufficient documentation

## 2015-07-26 DIAGNOSIS — O26899 Other specified pregnancy related conditions, unspecified trimester: Secondary | ICD-10-CM

## 2015-07-26 DIAGNOSIS — O23591 Infection of other part of genital tract in pregnancy, first trimester: Secondary | ICD-10-CM | POA: Insufficient documentation

## 2015-07-26 DIAGNOSIS — B9689 Other specified bacterial agents as the cause of diseases classified elsewhere: Secondary | ICD-10-CM

## 2015-07-26 HISTORY — DX: Major depressive disorder, single episode, unspecified: F32.9

## 2015-07-26 HISTORY — DX: Mental disorder, not otherwise specified: F99

## 2015-07-26 HISTORY — DX: Anxiety disorder, unspecified: F41.9

## 2015-07-26 HISTORY — DX: Depression, unspecified: F32.A

## 2015-07-26 LAB — URINALYSIS, ROUTINE W REFLEX MICROSCOPIC
Bilirubin Urine: NEGATIVE
Glucose, UA: NEGATIVE mg/dL
Hgb urine dipstick: NEGATIVE
Ketones, ur: 15 mg/dL — AB
Leukocytes, UA: NEGATIVE
Nitrite: NEGATIVE
Protein, ur: NEGATIVE mg/dL
Specific Gravity, Urine: 1.01 (ref 1.005–1.030)
pH: 8 (ref 5.0–8.0)

## 2015-07-26 LAB — WET PREP, GENITAL
Sperm: NONE SEEN
Trich, Wet Prep: NONE SEEN
WBC, Wet Prep HPF POC: NONE SEEN
Yeast Wet Prep HPF POC: NONE SEEN

## 2015-07-26 LAB — CBC
HCT: 37 % (ref 36.0–46.0)
Hemoglobin: 13.2 g/dL (ref 12.0–15.0)
MCH: 29.9 pg (ref 26.0–34.0)
MCHC: 35.7 g/dL (ref 30.0–36.0)
MCV: 83.9 fL (ref 78.0–100.0)
Platelets: 255 10*3/uL (ref 150–400)
RBC: 4.41 MIL/uL (ref 3.87–5.11)
RDW: 12.4 % (ref 11.5–15.5)
WBC: 7.6 10*3/uL (ref 4.0–10.5)

## 2015-07-26 LAB — HCG, QUANTITATIVE, PREGNANCY: hCG, Beta Chain, Quant, S: 134859 m[IU]/mL — ABNORMAL HIGH (ref <5)

## 2015-07-26 LAB — POCT PREGNANCY, URINE: Preg Test, Ur: POSITIVE — AB

## 2015-07-26 MED ORDER — METRONIDAZOLE 500 MG PO TABS: 500.0000 mg | ORAL_TABLET | Freq: Two times a day (BID) | ORAL | Status: DC

## 2015-07-26 NOTE — MAU Note (Signed)
Last year at this time had a missed AB, cramping and spotting.  Is pregnant, cramping but not spotting.some pressure, no bleeding.

## 2015-07-26 NOTE — MAU Provider Note (Signed)
History     CSN: [id number]  Arrival date and time: 07/26/15 1657   First Provider Initiated Contact with Patient 07/26/15 2129      Chief Complaint  Patient presents with  . Possible Pregnancy  . Abdominal Pain   HPI [name] is a 25 y.o. G3P0010 at [redacted]w[redacted]d who presents to MAU today with complaint of abdominal cramping for the last few weeks. The patient denies vaginal bleeding, UTI symptoms or fever. She states pain is mild. She has not taken anything for pain. She endorses mild nausea without vomiting or diarrhea. She also states some mild constipation. Last BM was Sunday. She has a small amount of milky white discharge as well.   OB History    Gravida Para Term Preterm AB TAB SAB Ectopic Multiple Living   3    1 1     0      Past Medical History  Diagnosis Date  . Mental disorder   . Anxiety   . Depression     Past Surgical History  Procedure Laterality Date  . Mouth surgery    . Tooth extraction      Family History  Problem Relation Age of Onset  . Hypertension Mother     Social History  Substance Use Topics  . Smoking status: Former Smoker -- 0.25 packs/day    Types: Cigarettes    Quit date: 07/15/2014  . Smokeless tobacco: Never Used  . Alcohol Use: No     Comment: socially    Allergies:  Allergies  Allergen Reactions  . Dust Mite Extract Other (See Comments) and Cough    Causes congestion, sore throat  . Pollen Extract Other (See Comments) and Cough    Causes congestion, sore throat    Prescriptions prior to admission  Medication Sig Dispense Refill Last Dose  . ibuprofen (ADVIL,MOTRIN) 200 MG tablet Take 400 mg by mouth every 6 (six) hours as needed for moderate pain.   Past Week at Unknown time  . nicotine polacrilex (NICORETTE) 2 MG gum Take 1 each (2 mg total) by mouth as needed for smoking cessation. (Patient not taking: Reported on 07/26/2015) 100 tablet 0   . traZODone (DESYREL) 50 MG tablet Take 1 tablet (50 mg total) by mouth at  bedtime as needed and may repeat dose one time if needed for sleep. (Patient not taking: Reported on 07/26/2015) 30 tablet 0     Review of Systems  Constitutional: Negative for fever and malaise/fatigue.  Gastrointestinal: Positive for nausea, abdominal pain and constipation. Negative for vomiting and diarrhea.  Genitourinary: Negative for dysuria, urgency and frequency.       Neg - vaginal bleeding + vaginal discharge   Physical Exam   Blood pressure 125/88, pulse 83, temperature 98.3 F (36.8 C), temperature source Oral, resp. rate 18, height 5\' 5"  (1.651 m), weight 157 lb 12.8 oz (71.578 kg), last menstrual period 06/05/2015.  Physical Exam  Nursing note and vitals reviewed. Constitutional: She is oriented to person, place, and time. She appears well-developed and well-nourished. No distress.  HENT:  Head: Normocephalic and atraumatic.  Cardiovascular: Normal rate.   Respiratory: Effort normal.  GI: Soft. Bowel sounds are normal. She exhibits no distension and no mass. There is no tenderness. There is no rebound and no guarding.  Neurological: She is alert and oriented to person, place, and time.  Skin: Skin is warm and dry. No erythema.  Psychiatric: She has a normal mood and affect.    Results for  orders placed or performed during the hospital encounter of 07/26/15 (from the past 24 hour(s))  Urinalysis, Routine w reflex microscopic (not [facility])     Status: Abnormal   Collection Time: 07/26/15  5:40 PM  Result Value Ref [place]   Color, Urine YELLOW YELLOW   APPearance HAZY (A) CLEAR   Specific Gravity, Urine 1.010 1.005 - 1.030   pH 8.0 5.0 - 8.0   Glucose, UA NEGATIVE NEGATIVE mg/dL   Hgb urine dipstick NEGATIVE NEGATIVE   Bilirubin Urine NEGATIVE NEGATIVE   Ketones, ur 15 (A) NEGATIVE mg/dL   Protein, ur NEGATIVE NEGATIVE mg/dL   Nitrite NEGATIVE NEGATIVE   Leukocytes, UA NEGATIVE NEGATIVE  Pregnancy, urine POC     Status: Abnormal   Collection Time: 07/26/15  5:46 PM   Result Value Ref Sitzmann   Preg Test, Ur POSITIVE (A) NEGATIVE  hCG, quantitative, pregnancy     Status: Abnormal   Collection Time: 07/26/15  6:04 PM  Result Value Ref [place]   hCG, Beta Chain, Quant, S 134859 (H) <5 mIU/mL  CBC     Status: None   Collection Time: 07/26/15  6:04 PM  Result Value Ref [place]   WBC 7.6 4.0 - 10.5 K/uL   RBC 4.41 3.87 - 5.11 MIL/uL   Hemoglobin 13.2 12.0 - 15.0 g/dL   HCT 16.137.0 09.636.0 - 04.546.0 %   MCV 83.9 78.0 - 100.0 fL   MCH 29.9 26.0 - 34.0 pg   MCHC 35.7 30.0 - 36.0 g/dL   RDW 40.912.4 81.111.5 - 91.415.5 %   Platelets 255 150 - 400 K/uL  Wet prep, genital     Status: Abnormal   Collection Time: 07/26/15  7:52 PM  Result Value Ref Boch   Yeast Wet Prep HPF POC NONE SEEN NONE SEEN   Trich, Wet Prep NONE SEEN NONE SEEN   Clue Cells Wet Prep HPF POC PRESENT (A) NONE SEEN   WBC, Wet Prep HPF POC NONE SEEN NONE SEEN   Sperm NONE SEEN    [place] Ob Comp Less 14 Wks  07/26/2015  CLINICAL DATA:  Pelvic cramping since 07/15/2015. Seven weeks and 2 days pregnant by last menstrual period. Quantitative beta HCG 134,859. EXAM: OBSTETRIC <14 WK ULTRASOUND TECHNIQUE: Transabdominal ultrasound was performed for evaluation of the gestation as well as the maternal uterus and adnexal regions. COMPARISON:  08/07/2014. FINDINGS: Intrauterine gestational sac: Visualized/normal in shape. Yolk sac:  Visualized/normal in shape. Embryo:  Visualized Cardiac Activity: Visualized Heart Rate: 154 bpm CRL:   15.5  mm   7 w 6 d                  US EDC: 03/07/2016. Subchorionic hemorrhage:  None. Maternal uterus/adnexae: Normal appearing maternal ovaries. No free peritoneal fluid. IMPRESSION: Single live intrauterine gestation with an estimated gestational age of [redacted] weeks and 6 days. No complicating features. Electronically Signed   By: Beckie SaltsSteven  Reid M.D.   On: 07/26/2015 20:14   [place] Ob Transvaginal  07/26/2015  CLINICAL DATA:  Pelvic cramping since 07/15/2015. Seven weeks and 2 days pregnant by last menstrual  period. Quantitative beta HCG 134,859. EXAM: OBSTETRIC <14 WK ULTRASOUND TECHNIQUE: Transabdominal ultrasound was performed for evaluation of the gestation as well as the maternal uterus and adnexal regions. COMPARISON:  08/07/2014. FINDINGS: Intrauterine gestational sac: Visualized/normal in shape. Yolk sac:  Visualized/normal in shape. Embryo:  Visualized Cardiac Activity: Visualized Heart Rate: 154 bpm CRL:   15.5  mm   7 w 6  d                  [place] EDC: 03/07/2016. Subchorionic hemorrhage:  None. Maternal uterus/adnexae: Normal appearing maternal ovaries. No free peritoneal fluid. IMPRESSION: Single live intrauterine gestation with an estimated gestational age of [redacted] weeks and 6 days. No complicating features. Electronically Signed   By: Beckie Salts M.D.   On: 07/26/2015 20:14     MAU Course  Procedures None  MDM +UPT UA, wet prep, GC/chlamydia, CBC, quant hCG, HIV, RPR and [place] today to rule out ectopic pregnancy  Assessment and Plan  A: SIUP at [redacted]w[redacted]d Bacterial vaginosis Abdominal pain in pregnancy, first trimester  P: Discharge home Rx for Flagyl given to patient  Advised to start prenatal vitamins Tylenol PRN for pain advised First trimester precautions discussed Patient advised to follow-up with OB provider of choice to start prenatal care. List of area providers given. Pregnancy confirmation letter given.  Patient may return to MAU as needed or if her condition were to change or worsen   Marny Lowenstein, PA-C  07/26/2015, 9:36 PM

## 2015-07-26 NOTE — Discharge Instructions (Signed)
Bacterial Vaginosis °Bacterial vaginosis is an infection of the vagina. It happens when too many germs (bacteria) grow in the vagina. Having this infection puts you at risk for getting other infections from sex. Treating this infection can help lower your risk for other infections, such as:  °· Chlamydia. °· Gonorrhea. °· HIV. °· Herpes. °HOME CARE °· Take your medicine as told by your doctor. °· Finish your medicine even if you start to feel better. °· Tell your sex partner that you have an infection. They should see their doctor for treatment. °· During treatment: °¨ Avoid sex or use condoms correctly. °¨ Do not douche. °¨ Do not drink alcohol unless your doctor tells you it is ok. °¨ Do not breastfeed unless your doctor tells you it is ok. °GET HELP IF: °· You are not getting better after 3 days of treatment. °· You have more grey fluid (discharge) coming from your vagina than before. °· You have more pain than before. °· You have a fever. °MAKE SURE YOU:  °· Understand these instructions. °· Will watch your condition. °· Will get help right away if you are not doing well or get worse. °  °This information is not intended to replace advice given to you by your health care provider. Make sure you discuss any questions you have with your health care provider. °  °Document Released: 11/16/2007 Document Revised: 02/27/2014 Document Reviewed: 09/18/2012 °Elsevier Interactive Patient Education ©2016 Elsevier Inc[employer] °First Trimester of Pregnancy °The first trimester of pregnancy is from week 1 until the end of week 12 (months 1 through 3). During this time, your baby will begin to develop inside you. At 6-8 weeks, the eyes and face are formed, and the heartbeat can be seen on ultrasound. At the end of 12 weeks, all the baby's organs are formed. Prenatal care is all the medical care you receive before the birth of your baby. Make sure you get good prenatal care and follow all of your doctor's instructions. °HOME CARE    °Medicines °· Take medicine only as told by your doctor. Some medicines are safe and some are not during pregnancy. °· Take your prenatal vitamins as told by your doctor. °· Take medicine that helps you poop (stool softener) as needed if your doctor says it is okay. °Diet °· Eat regular, healthy meals. °· Your doctor will tell you the amount of weight gain that is right for you. °· Avoid raw meat and uncooked cheese. °· If you feel sick to your stomach (nauseous) or throw up (vomit): °¨ Eat 4 or 5 small meals a day instead of 3 large meals. °¨ Try eating a few soda crackers. °¨ Drink liquids between meals instead of during meals. °· If you have a hard time pooping (constipation): °¨ Eat high-fiber foods like fresh vegetables, fruit, and whole grains. °¨ Drink enough fluids to keep your pee (urine) clear or pale yellow. °Activity and Exercise °· Exercise only as told by your doctor. Stop exercising if you have cramps or pain in your lower belly (abdomen) or low back. °· Try to avoid standing for long periods of time. Move your legs often if you must stand in one place for a long time. °· Avoid heavy lifting. °· Wear low-heeled shoes. Sit and stand up straight. °· You can have sex unless your doctor tells you not to. °Relief of Pain or Discomfort °· Wear a good support bra if your breasts are sore. °· Take warm water baths (sitz baths)   to soothe pain or discomfort caused by hemorrhoids. Use hemorrhoid cream if your doctor says it is okay. °· Rest with your legs raised if you have leg cramps or low back pain. °· Wear support hose if you have puffy, bulging veins (varicose veins) in your legs. Raise (elevate) your feet for 15 minutes, 3-4 times a day. Limit salt in your diet. °Prenatal Care °· Schedule your prenatal visits by the twelfth week of pregnancy. °· Write down your questions. Take them to your prenatal visits. °· Keep all your prenatal visits as told by your doctor. °Safety °· Wear your seat belt at all times  when driving. °· Make a list of emergency phone numbers. The list should include numbers for family, friends, the hospital, and police and fire departments. °General Tips °· Ask your doctor for a referral to a local prenatal class. Begin classes no later than at the start of month 6 of your pregnancy. °· Ask for help if you need counseling or help with nutrition. Your doctor can give you advice or tell you where to go for help. °· Do not use hot tubs, steam rooms, or saunas. °· Do not douche or use tampons or scented sanitary pads. °· Do not cross your legs for long periods of time. °· Avoid litter boxes and soil used by cats. °· Avoid all smoking, herbs, and alcohol. Avoid drugs not approved by your doctor. °· Do not use any tobacco products, including cigarettes, chewing tobacco, and electronic cigarettes. If you need help quitting, ask your doctor. You may get counseling or other support to help you quit. °· Visit your dentist. At home, brush your teeth with a soft toothbrush. Be gentle when you floss. °GET HELP IF: °· You are dizzy. °· You have mild cramps or pressure in your lower belly. °· You have a nagging pain in your belly area. °· You continue to feel sick to your stomach, throw up, or have watery poop (diarrhea). °· You have a bad smelling fluid coming from your vagina. °· You have pain with peeing (urination). °· You have increased puffiness (swelling) in your face, hands, legs, or ankles. °GET HELP RIGHT AWAY IF:  °· You have a fever. °· You are leaking fluid from your vagina. °· You have spotting or bleeding from your vagina. °· You have very bad belly cramping or pain. °· You gain or lose weight rapidly. °· You throw up blood. It may look like coffee grounds. °· You are around people who have German measles, fifth disease, or chickenpox. °· You have a very bad headache. °· You have shortness of breath. °· You have any kind of trauma, such as from a fall or a car accident. °  °This information is not  intended to replace advice given to you by your health care provider. Make sure you discuss any questions you have with your health care provider. °  °Document Released: 07/26/2007 Document Revised: 02/27/2014 Document Reviewed: 12/17/2012 °Elsevier Interactive Patient Education ©2016 Elsevier Inc. ° °

## 2015-07-26 NOTE — MAU Note (Signed)
Stopped psych meds when had pos preg test

## 2015-07-27 LAB — HIV ANTIBODY (ROUTINE TESTING W REFLEX): HIV Screen 4th Generation wRfx: NONREACTIVE

## 2015-07-28 LAB — GC/CHLAMYDIA PROBE AMP (~~LOC~~) NOT AT ARMC
Chlamydia: NEGATIVE
Neisseria Gonorrhea: NEGATIVE

## 2015-09-28 ENCOUNTER — Other Ambulatory Visit: Payer: Self-pay | Admitting: Obstetrics and Gynecology

## 2015-09-28 LAB — OB RESULTS CONSOLE HEPATITIS B SURFACE ANTIGEN: Hepatitis B Surface Ag: NEGATIVE

## 2015-09-28 LAB — OB RESULTS CONSOLE GC/CHLAMYDIA
Chlamydia: NEGATIVE
Gonorrhea: NEGATIVE
Gonorrhea: NEGATIVE

## 2015-09-28 LAB — OB RESULTS CONSOLE ABO/RH: RH Type: POSITIVE

## 2015-09-28 LAB — OB RESULTS CONSOLE RUBELLA ANTIBODY, IGM: Rubella: NON-IMMUNE/NOT IMMUNE

## 2015-09-28 LAB — OB RESULTS CONSOLE HIV ANTIBODY (ROUTINE TESTING): HIV: NONREACTIVE

## 2015-09-28 LAB — OB RESULTS CONSOLE ANTIBODY SCREEN: Antibody Screen: NEGATIVE

## 2015-09-28 LAB — OB RESULTS CONSOLE RPR: RPR: NONREACTIVE

## 2015-09-29 LAB — CYTOLOGY - PAP

## 2016-02-03 ENCOUNTER — Other Ambulatory Visit: Payer: Self-pay | Admitting: Obstetrics

## 2016-02-03 LAB — OB RESULTS CONSOLE GBS: GBS: NEGATIVE

## 2016-02-09 ENCOUNTER — Telehealth (HOSPITAL_COMMUNITY): Payer: Self-pay | Admitting: *Deleted

## 2016-02-10 ENCOUNTER — Encounter (HOSPITAL_COMMUNITY): Payer: Self-pay | Admitting: *Deleted

## 2016-02-10 NOTE — Telephone Encounter (Signed)
Preadmission screen  

## 2016-02-17 ENCOUNTER — Encounter (HOSPITAL_COMMUNITY): Payer: Self-pay

## 2016-02-17 ENCOUNTER — Inpatient Hospital Stay (HOSPITAL_COMMUNITY)
Admission: RE | Admit: 2016-02-17 | Discharge: 2016-02-19 | DRG: 775 | Disposition: A | Discharge status: Home / Self Care | Payer: Managed Care, Other (non HMO) | Source: Ambulatory Visit | Attending: Obstetrics and Gynecology | Admitting: Obstetrics and Gynecology

## 2016-02-17 ENCOUNTER — Inpatient Hospital Stay (HOSPITAL_COMMUNITY): Payer: Managed Care, Other (non HMO) | Admitting: Anesthesiology

## 2016-02-17 VITALS — BP 136/86 | HR 88 | Temp 98.5°F | Resp 20 | Ht 65.0 in | Wt 230.0 lb

## 2016-02-17 DIAGNOSIS — Z87891 Personal history of nicotine dependence: Secondary | ICD-10-CM

## 2016-02-17 DIAGNOSIS — Z6838 Body mass index (BMI) 38.0-38.9, adult: Secondary | ICD-10-CM

## 2016-02-17 DIAGNOSIS — Z3A37 37 weeks gestation of pregnancy: Secondary | ICD-10-CM

## 2016-02-17 DIAGNOSIS — O99214 Obesity complicating childbirth: Secondary | ICD-10-CM | POA: Diagnosis present

## 2016-02-17 DIAGNOSIS — O134 Gestational [pregnancy-induced] hypertension without significant proteinuria, complicating childbirth: Principal | ICD-10-CM | POA: Diagnosis present

## 2016-02-17 DIAGNOSIS — Z3490 Encounter for supervision of normal pregnancy, unspecified, unspecified trimester: Secondary | ICD-10-CM

## 2016-02-17 DIAGNOSIS — O133 Gestational [pregnancy-induced] hypertension without significant proteinuria, third trimester: Secondary | ICD-10-CM | POA: Diagnosis present

## 2016-02-17 LAB — CBC
HCT: 30.2 % — ABNORMAL LOW (ref 36.0–46.0)
HCT: 28.5 % — ABNORMAL LOW (ref 36.0–46.0)
Hemoglobin: 9.4 g/dL — ABNORMAL LOW (ref 12.0–15.0)
Hemoglobin: 9.1 g/dL — ABNORMAL LOW (ref 12.0–15.0)
MCH: 24.9 pg — ABNORMAL LOW (ref 26.0–34.0)
MCH: 25.3 pg — ABNORMAL LOW (ref 26.0–34.0)
MCHC: 31.1 g/dL (ref 30.0–36.0)
MCHC: 31.9 g/dL (ref 30.0–36.0)
MCV: 79.9 fL (ref 78.0–100.0)
MCV: 79.2 fL (ref 78.0–100.0)
Platelets: 227 10*3/uL (ref 150–400)
Platelets: 213 10*3/uL (ref 150–400)
RBC: 3.78 MIL/uL — ABNORMAL LOW (ref 3.87–5.11)
RBC: 3.6 MIL/uL — ABNORMAL LOW (ref 3.87–5.11)
RDW: 15.5 % (ref 11.5–15.5)
RDW: 15.5 % (ref 11.5–15.5)
WBC: 8.4 10*3/uL (ref 4.0–10.5)
WBC: 9 10*3/uL (ref 4.0–10.5)

## 2016-02-17 LAB — COMPREHENSIVE METABOLIC PANEL
ALT: 16 U/L (ref 14–54)
AST: 35 U/L (ref 15–41)
Albumin: 3 g/dL — ABNORMAL LOW (ref 3.5–5.0)
Alkaline Phosphatase: 191 U/L — ABNORMAL HIGH (ref 38–126)
Anion gap: 7 (ref 5–15)
BUN: <5 mg/dL — ABNORMAL LOW (ref 6–20)
CO2: 23 mmol/L (ref 22–32)
Calcium: 9 mg/dL (ref 8.9–10.3)
Chloride: 103 mmol/L (ref 101–111)
Creatinine, Ser: 0.34 mg/dL — ABNORMAL LOW (ref 0.44–1.00)
GFR calc Af Amer: >60 mL/min (ref >60)
GFR calc non Af Amer: >60 mL/min (ref >60)
Glucose, Bld: 81 mg/dL (ref 65–99)
Potassium: 3.6 mmol/L (ref 3.5–5.1)
Sodium: 133 mmol/L — ABNORMAL LOW (ref 135–145)
Total Bilirubin: 0.2 mg/dL — ABNORMAL LOW (ref 0.3–1.2)
Total Protein: 6.2 g/dL — ABNORMAL LOW (ref 6.5–8.1)

## 2016-02-17 LAB — TYPE AND SCREEN
ABO/RH(D): O POS
Antibody Screen: NEGATIVE

## 2016-02-17 LAB — RPR: RPR Ser Ql: NONREACTIVE

## 2016-02-17 MED ORDER — OXYCODONE-ACETAMINOPHEN 5-325 MG PO TABS: 2.0000 | ORAL_TABLET | ORAL | Status: DC | PRN

## 2016-02-17 MED ORDER — LACTATED RINGERS IV SOLN
INTRAVENOUS | Status: DC
  Administered 2016-02-17 (×3): via INTRAVENOUS

## 2016-02-17 MED ORDER — EPHEDRINE 5 MG/ML INJ
10.0000 mg | INTRAVENOUS | Status: DC | PRN
  Filled 2016-02-17: qty 4

## 2016-02-17 MED ORDER — DIPHENHYDRAMINE HCL 50 MG/ML IJ SOLN
12.5000 mg | INTRAMUSCULAR | Status: DC | PRN
Start: 2016-02-17 — End: 2016-02-18

## 2016-02-17 MED ORDER — ACETAMINOPHEN 325 MG PO TABS: 650.0000 mg | ORAL_TABLET | ORAL | Status: DC | PRN

## 2016-02-17 MED ORDER — EPHEDRINE 5 MG/ML INJ: 10.0000 mg | INTRAVENOUS | Status: DC | PRN

## 2016-02-17 MED ORDER — TERBUTALINE SULFATE 1 MG/ML IJ SOLN
0.2500 mg | Freq: Once | INTRAMUSCULAR | Status: DC | PRN
  Filled 2016-02-17: qty 1

## 2016-02-17 MED ORDER — OXYTOCIN 40 UNITS IN LACTATED RINGERS INFUSION - SIMPLE MED
2.5000 [IU]/h | INTRAVENOUS | Status: DC
  Filled 2016-02-17: qty 1000

## 2016-02-17 MED ORDER — PHENYLEPHRINE 40 MCG/ML (10ML) SYRINGE FOR IV PUSH (FOR BLOOD PRESSURE SUPPORT): 80.0000 ug | PREFILLED_SYRINGE | INTRAVENOUS | Status: DC | PRN

## 2016-02-17 MED ORDER — FENTANYL 2.5 MCG/ML BUPIVACAINE 1/10 % EPIDURAL INFUSION (WH - ANES)
14.0000 mL/h | INTRAMUSCULAR | Status: DC | PRN
  Administered 2016-02-17 – 2016-02-18 (×4): 14 mL/h via EPIDURAL
  Filled 2016-02-17 (×4): qty 100

## 2016-02-17 MED ORDER — OXYCODONE-ACETAMINOPHEN 5-325 MG PO TABS: 1.0000 | ORAL_TABLET | ORAL | Status: DC | PRN

## 2016-02-17 MED ORDER — SOD CITRATE-CITRIC ACID 500-334 MG/5ML PO SOLN
30.0000 mL | ORAL | Status: DC | PRN
  Administered 2016-02-17: 30 mL via ORAL
  Filled 2016-02-17: qty 15

## 2016-02-17 MED ORDER — OXYTOCIN 40 UNITS IN LACTATED RINGERS INFUSION - SIMPLE MED
1.0000 m[IU]/min | INTRAVENOUS | Status: DC
  Administered 2016-02-17: 2 m[IU]/min via INTRAVENOUS

## 2016-02-17 MED ORDER — OXYTOCIN BOLUS FROM INFUSION
500.0000 mL | Freq: Once | INTRAVENOUS | Status: AC
  Administered 2016-02-18: 999 mL/h via INTRAVENOUS

## 2016-02-17 MED ORDER — FENTANYL CITRATE (PF) 100 MCG/2ML IJ SOLN
50.0000 ug | INTRAMUSCULAR | Status: DC | PRN
  Administered 2016-02-17: 100 ug via INTRAVENOUS
  Filled 2016-02-17: qty 2

## 2016-02-17 MED ORDER — LACTATED RINGERS IV SOLN: 500.0000 mL | Freq: Once | INTRAVENOUS | Status: DC

## 2016-02-17 MED ORDER — PHENYLEPHRINE 40 MCG/ML (10ML) SYRINGE FOR IV PUSH (FOR BLOOD PRESSURE SUPPORT)
80.0000 ug | PREFILLED_SYRINGE | INTRAVENOUS | Status: DC | PRN
  Filled 2016-02-17: qty 5
  Filled 2016-02-17: qty 10

## 2016-02-17 MED ORDER — LACTATED RINGERS IV SOLN: INTRAVENOUS | Status: DC

## 2016-02-17 MED ORDER — DIPHENHYDRAMINE HCL 50 MG/ML IJ SOLN: 12.5000 mg | INTRAMUSCULAR | Status: DC | PRN

## 2016-02-17 MED ORDER — ONDANSETRON HCL 4 MG/2ML IJ SOLN: 4.0000 mg | Freq: Four times a day (QID) | INTRAMUSCULAR | Status: DC | PRN

## 2016-02-17 MED ORDER — LIDOCAINE HCL (PF) 1 % IJ SOLN
30.0000 mL | INTRAMUSCULAR | Status: DC | PRN
  Filled 2016-02-17: qty 30

## 2016-02-17 MED ORDER — LACTATED RINGERS IV SOLN
500.0000 mL | Freq: Once | INTRAVENOUS | Status: AC
  Administered 2016-02-17: 12:00:00 via INTRAVENOUS

## 2016-02-17 MED ORDER — MISOPROSTOL 25 MCG QUARTER TABLET
25.0000 ug | ORAL_TABLET | ORAL | Status: DC | PRN
Start: 2016-02-17 — End: 2016-02-18
  Administered 2016-02-17: 25 ug via VAGINAL
  Filled 2016-02-17 (×2): qty 0.25
  Filled 2016-02-17: qty 1

## 2016-02-17 MED ORDER — FENTANYL 2.5 MCG/ML BUPIVACAINE 1/10 % EPIDURAL INFUSION (WH - ANES): 14.0000 mL/h | INTRAMUSCULAR | Status: DC | PRN

## 2016-02-17 MED ORDER — TERBUTALINE SULFATE 1 MG/ML IJ SOLN
0.2500 mg | Freq: Once | INTRAMUSCULAR | Status: DC | PRN
Start: 2016-02-17 — End: 2016-02-18
  Filled 2016-02-17: qty 1

## 2016-02-17 MED ORDER — LIDOCAINE HCL (PF) 1 % IJ SOLN
INTRAMUSCULAR | Status: DC | PRN
  Administered 2016-02-17 (×2): 5 mL via EPIDURAL

## 2016-02-17 MED ORDER — PHENYLEPHRINE 40 MCG/ML (10ML) SYRINGE FOR IV PUSH (FOR BLOOD PRESSURE SUPPORT)
80.0000 ug | PREFILLED_SYRINGE | INTRAVENOUS | Status: DC | PRN
  Filled 2016-02-17: qty 10
  Filled 2016-02-17: qty 5

## 2016-02-17 MED ORDER — LACTATED RINGERS IV SOLN
500.0000 mL | INTRAVENOUS | Status: DC | PRN
  Administered 2016-02-17: 500 mL via INTRAVENOUS

## 2016-02-17 NOTE — H&P (Signed)
Pt is a 25 y/o black female, G1P0 at 37 wks who is admitted for induction for HTN.  PMHX: See PNR PE: 151/88         HEENT-wnl         Abd-gravid         Cx-50/1/-2 vtx          [facility]HTs reactive IMP/ [facility]UP at 37 wks [facility]/ Admit, start induction

## 2016-02-17 NOTE — Progress Notes (Signed)
I received a consult that pt was interested in creating an advance directive.  I gave information to her nurse along with my card.  She did not wish to fill it out at this time, but she knows of my availability if she wants to do so later.    [address] LaneChaplain Dyanne CarrelKaty Joran Kallal, Bcc Pager, [phone] 2:44 PM    02/17/16 1400  Clinical Encounter Type  Visited With Health care provider

## 2016-02-17 NOTE — Anesthesia Preprocedure Evaluation (Signed)
Anesthesia Evaluation  Patient identified by MRN, date of birth, ID band Patient awake    Reviewed: Allergy & Precautions, NPO status , Patient's Chart, lab work & pertinent test results  Airway Mallampati: II  TM Distance: >3 FB Neck ROM: Full    Dental no notable dental hx. (+) Dental Advisory Given   Pulmonary neg pulmonary ROS, former smoker,    Pulmonary exam normal        Cardiovascular hypertension, negative cardio ROS Normal cardiovascular exam     Neuro/Psych negative neurological ROS  negative psych ROS   GI/Hepatic negative GI ROS, Neg liver ROS,   Endo/Other  Morbid obesity  Renal/GU negative Renal ROS  negative genitourinary   Musculoskeletal negative musculoskeletal ROS (+)   Abdominal   Peds negative pediatric ROS (+)  Hematology negative hematology ROS (+)   Anesthesia Other Findings   Reproductive/Obstetrics (+) Pregnancy                             Anesthesia Physical Anesthesia Plan  ASA: III  Anesthesia Plan: Epidural   Post-op Pain Management:    Induction:   Airway Management Planned: Natural Airway  Additional Equipment:   Intra-op Plan:   Post-operative Plan:   Informed Consent: I have reviewed the patients History and Physical, chart, labs and discussed the procedure including the risks, benefits and alternatives for the proposed anesthesia with the patient or authorized representative who has indicated his/her understanding and acceptance.   Dental advisory given  Plan Discussed with: Anesthesiologist  Anesthesia Plan Comments:         Anesthesia Quick Evaluation

## 2016-02-17 NOTE — Anesthesia Pain Management Evaluation Note (Signed)
  CRNA Pain Management Visit Note  Patient: Carol Nichols, 25 y.o., female  "[facility] We have an anesthesia team available at all times to provide care throughout the hospital, including epidural management and anesthesia for C-section. I don't know your plan for the delivery whether it a natural birth, water birth, IV sedation, nitrous supplementation, doula or epidural, but we want to meet your pain goals."   1.Was your pain managed to your expectations on prior hospitalizations?   yes  2.What is your expectation for pain management during this hospitalization?     Epidural  3.How can we help you reach that goal? epidural  Record the patient's initial score and the patient's pain goal.   Pain: 0/10  Pain Goal: 0/10 [facility] wants you to be able to say your pain was always managed very well.  Salome ArntSterling, Rosezetta Balderston Marie 02/17/2016

## 2016-02-18 ENCOUNTER — Encounter (HOSPITAL_COMMUNITY): Payer: Self-pay

## 2016-02-18 DIAGNOSIS — Z3490 Encounter for supervision of normal pregnancy, unspecified, unspecified trimester: Secondary | ICD-10-CM

## 2016-02-18 MED ORDER — SENNOSIDES-DOCUSATE SODIUM 8.6-50 MG PO TABS
2.0000 | ORAL_TABLET | ORAL | Status: DC
  Administered 2016-02-19: 2 via ORAL
  Filled 2016-02-18: qty 2

## 2016-02-18 MED ORDER — ONDANSETRON HCL 4 MG PO TABS: 4.0000 mg | ORAL_TABLET | ORAL | Status: DC | PRN

## 2016-02-18 MED ORDER — IBUPROFEN 600 MG PO TABS
600.0000 mg | ORAL_TABLET | Freq: Four times a day (QID) | ORAL | Status: DC
  Administered 2016-02-18 – 2016-02-19 (×6): 600 mg via ORAL
  Filled 2016-02-18 (×6): qty 1

## 2016-02-18 MED ORDER — MEASLES, MUMPS & RUBELLA VAC ~~LOC~~ INJ
0.5000 mL | INJECTION | Freq: Once | SUBCUTANEOUS | Status: AC
  Administered 2016-02-19: 0.5 mL via SUBCUTANEOUS
  Filled 2016-02-18 (×2): qty 0.5

## 2016-02-18 MED ORDER — BENZOCAINE-MENTHOL 20-0.5 % EX AERO: 1.0000 "application " | INHALATION_SPRAY | CUTANEOUS | Status: DC | PRN

## 2016-02-18 MED ORDER — COCONUT OIL OIL
1.0000 "application " | TOPICAL_OIL | Status: DC | PRN
  Filled 2016-02-18: qty 120

## 2016-02-18 MED ORDER — DIBUCAINE 1 % RE OINT: 1.0000 "application " | TOPICAL_OINTMENT | RECTAL | Status: DC | PRN

## 2016-02-18 MED ORDER — ZOLPIDEM TARTRATE 5 MG PO TABS: 5.0000 mg | ORAL_TABLET | Freq: Every evening | ORAL | Status: DC | PRN

## 2016-02-18 MED ORDER — WITCH HAZEL-GLYCERIN EX PADS: 1.0000 "application " | MEDICATED_PAD | CUTANEOUS | Status: DC | PRN

## 2016-02-18 MED ORDER — ONDANSETRON HCL 4 MG/2ML IJ SOLN: 4.0000 mg | INTRAMUSCULAR | Status: DC | PRN

## 2016-02-18 MED ORDER — ACETAMINOPHEN 325 MG PO TABS
650.0000 mg | ORAL_TABLET | ORAL | Status: DC | PRN
  Administered 2016-02-18: 650 mg via ORAL
  Filled 2016-02-18: qty 2

## 2016-02-18 MED ORDER — SIMETHICONE 80 MG PO CHEW: 80.0000 mg | CHEWABLE_TABLET | ORAL | Status: DC | PRN

## 2016-02-18 MED ORDER — TETANUS-DIPHTH-ACELL PERTUSSIS 5-2.5-18.5 LF-MCG/0.5 IM SUSP: 0.5000 mL | Freq: Once | INTRAMUSCULAR | Status: DC

## 2016-02-18 NOTE — Lactation Note (Signed)
This note was copied from a baby's chart. Lactation Consultation Note: infant is 6813 hours old and was born at 37.3 weeks. This is mothers first child. Infant has had 2 feedings and was spoon fed once with 4 ml. Mother has good flow of colostrum.  Assist mother with sandwiching breast to latch infant. Infant latched on with a shallow latch pinching nipple. relatched infant with a deeper latch. Mother was taught tea-cup hold. Infant suckled on and off on both for 15 mins. Mother has very large breast which makes latching difficult. Infant was then spoon fed 5 ml . Reviewed cue base feeding from handout in baby and me book.  Reviewed all basic teaching.  Lactation brochure given with information of all services.   Patient Name: Carol Nichols ZOXWR'UToday's Date: 02/18/2016 Reason for consult: Initial assessment   Maternal Data Has patient been taught Hand Expression?: Yes Does the patient have breastfeeding experience prior to this delivery?: No  Feeding Feeding Type: Breast Milk Length of feed: 15 min (on and  off)  LATCH Score/Interventions Latch: Repeated attempts needed to sustain latch, nipple held in mouth throughout feeding, stimulation needed to elicit sucking reflex. Intervention(s): Adjust position;Assist with latch;Breast compression  Audible Swallowing: A few with stimulation Intervention(s): Skin to skin;Hand expression Intervention(s): Skin to skin  Type of Nipple: Everted at rest and after stimulation  Comfort (Breast/Nipple): Soft / non-tender     Hold (Positioning): Assistance needed to correctly position infant at breast and maintain latch. Intervention(s): Breastfeeding basics reviewed;Support Pillows;Position options;Skin to skin  LATCH Score: 7  Lactation Tools Discussed/Used     Consult Status Consult Status: Follow-up Date: 02/18/16 Follow-up type: In-patient    Stevan BornKendrick, Katelin Kutsch [facility] 02/18/2016, 2:35 PM

## 2016-02-18 NOTE — Progress Notes (Signed)
Patient is eating, ambulating, voiding.  Pain control is good.  Vitals:   02/18/16 0215 02/18/16 0315 02/18/16 0540 02/18/16 0715  BP: (!) 159/96 (!) 153/88 134/66 (!) 148/85  Pulse: (!) 108 (!) 102 (!) 103 94  Resp: '18 18  18  '$ Temp: 99.1 F (37.3 C)   98 F (36.7 C)  TempSrc: Oral   Oral  SpO2: 99% 100%  100%  Weight:      Height:        Fundus firm Perineum without swelling.  Lab Results  Component Value Date   WBC 9.0 02/17/2016   HGB 9.1 (L) 02/17/2016   HCT 28.5 (L) 02/17/2016   MCV 79.2 02/17/2016   PLT 213 02/17/2016    --/--/O POS (12/28 0120)/R NI   A/P Post partum day 0.  BPs are slighly elevated- will watch.  Routine care.  Expect d/c routine.  MMR.  Arwen Haseley A

## 2016-02-18 NOTE — Anesthesia Postprocedure Evaluation (Signed)
Anesthesia Post Note  Patient: Chelsei Balin  Procedure(s) Performed: * No procedures listed *  Patient location during evaluation: Mother Baby Anesthesia Type: Epidural Level of consciousness: awake and alert, oriented and patient cooperative Pain management: pain level controlled Vital Signs Assessment: post-procedure vital signs reviewed and stable Respiratory status: spontaneous breathing Cardiovascular status: stable Postop Assessment: no headache, epidural receding, patient able to bend at knees and no signs of nausea or vomiting Anesthetic complications: no Comments: Pt states pain score of 5.  Pt received motrin at 5am.  Made request to RN for additional pain med.         Last Vitals:  Vitals:   02/18/16 0540 02/18/16 0715  BP: 134/66 (!) 148/85  Pulse: (!) 103 94  Resp:  18  Temp:  36.7 C    Last Pain:  Vitals:   02/18/16 0715  TempSrc: Oral  PainSc: 5    Pain Goal:                 [facility] ClovisWRINKLE,Abir Eroh

## 2016-02-18 NOTE — Progress Notes (Signed)
Dr. Dareen PianoAnderson notified of patients blood pressures. He wants us to continue to monitor and notify if pressures get to be greater than 160 systolic and or greater than 95 diastolic. Will continue to monitor. Carol Nichols, [facility], RN   Patient has voided 4 times in an hour in a half. Bladder scanned for 361, but patient was able to void immediately after obtaining that reading. Midline and firm. Will continue to monitor.

## 2016-02-18 NOTE — Discharge Summary (Signed)
Obstetric Discharge Summary Reason for Admission: induction of labor Prenatal Procedures: none Intrapartum Procedures: spontaneous vaginal delivery Postpartum Procedures: Rubella Ig Complications-Operative and Postpartum: none Hemoglobin  Date Value Ref [place] Status  02/17/2016 9.1 (L) 12.0 - 15.0 g/dL Final   HCT  Date Value Ref [place] Status  02/17/2016 28.5 (L) 36.0 - 46.0 % Final     Discharge Diagnoses: Term Pregnancy-delivered  Discharge Information: Date: 02/18/2016 Activity: pelvic rest Diet: routine Medications: Ibuprofen and Iron Condition: stable Instructions: refer to practice specific booklet Discharge to: home Follow-up Information    Levi AlandANDERSON,MARK E, MD Follow up in 4 week(s).   Specialty:  Obstetrics and Gynecology Contact information: [address] [place] [id number] [phone]           Newborn Data: Live born female  Birth Weight: 7 lb 4.4 oz (3300 g) APGAR: 8, 9  Home with mother.  Taelyr Jantz A 02/18/2016, 10:08 PM

## 2016-02-19 ENCOUNTER — Ambulatory Visit: Payer: Self-pay

## 2016-02-19 NOTE — Progress Notes (Signed)
Patient is eating, ambulating, voiding.  Pain control is good.  Vitals:   02/18/16 0900 02/18/16 1530 02/19/16 0640 02/19/16 0721  BP: 133/72 (!) 145/83 (!) 139/95 136/86  Pulse: 96 97 93 88  Resp: '20 20 20   '$ Temp: 98.2 F (36.8 C) 97.6 F (36.4 C) 98.5 F (36.9 C)   TempSrc: Oral Oral    SpO2:  100%    Weight:      Height:        Fundus firm Perineum without swelling.  Lab Results  Component Value Date   WBC 9.0 02/17/2016   HGB 9.1 (L) 02/17/2016   HCT 28.5 (L) 02/17/2016   MCV 79.2 02/17/2016   PLT 213 02/17/2016    --/--/O POS (12/28 0120)/R NI- MMR given  A/P Post partum day 2.  Routine care.  Expect d/c today.   BPs are stable Clanton Emanuelson A

## 2016-02-19 NOTE — Clinical Social Work Maternal (Signed)
CLINICAL SOCIAL WORK MATERNAL/CHILD NOTE  Patient Details  Name: Carol Nichols MRN: [id number] Date of Birth: 02/18/2017  Date:  02/19/2016  Clinical Social Worker Initiating Note:  Ferdinand Lango Domenik Trice, MSW, LCSW-A   Date/ Time Initiated:  02/19/16/1253              Child's Name:  Carol Nichols    Legal Guardian:  Other (Comment) (Not established by court system; MOB and FOB parent collectively )   Need for Interpreter:  None   Date of Referral:  02/18/16     Reason for Referral:  Current Substance Use/Substance Use During Pregnancy , Other (Comment) (MOB hx of depression )   Referral Source:  [facility]   Address:  [address] [place] [address]  Phone number:  [phone]   Household Members: Self   Natural Supports (not living in the home): Parent Albina Billet (babys maternal grandmother ) [phone])   Professional Supports:None, Other (Comment) (See's a psychiatrist for management of depression; was taking Celexa)   Employment:Full-time   Type of Work:     Education:      Printmaker   Other Resources: [facility]   Cultural/Religious Considerations Which May Impact Care: None reported.  Strengths: Ability to meet basic needs , Compliance with medical plan , Home prepared for child , Pediatrician chosen  Clinical research associate Pediatrics )   Risk Factors/Current Problems: Substance Use    Cognitive State: Alert , Goal Oriented , Able to Concentrate , Insightful    Mood/Affect: Calm , Comfortable , Interested    CSW Assessment:CSW met with MOB at bedside to complete assessment regarding hx of depression and current/hx of substance use. Upon this writers arrival, MOB was in bed performing skin to skin with baby. MOB's mom was visiting laying on the couch in the room watching Tv. With MOB's permission, this writer explained role and reasoning for visit. At this time, MOB endorses having  depression; however, notes it is managed with medication via a psychiatrist. MOB the depression is rather new but she is managing well. This Probation officer inquired if MOB is using substance to self-medicate depressive symptoms. MOB notes she is not and does not further disclose any information regarding substance. This Probation officer informed MOB of the hospitals policy and procedure regarding substance and mandatory reporting for positive screens. MOB verbalized understanding. This Probation officer discussed PPD and safe sleeping/SIDS. MOB verbalized understanding. At this time, no other needs were addressed or requested thus, CSW will continue to follow for pending UDS and CDS results.   CSW Plan/Description: Other (Comment), No Further Intervention Required/No Barriers to Discharge ([facility] will continue to follow pending CDS and UDS )    [place], MSW, [facility]  Office: [phone]

## 2016-02-19 NOTE — Lactation Note (Signed)
This note was copied from a baby's chart. Lactation Consultation Note  Patient Name: Carol Nichols Today's Date: 02/19/2016   Baby 42 hours old. Mom reports that she is continuing to pump, and she has 11 ml of EBM at bedside that she just pumped. However, mom states that she is not pumping with every feeding and she no longer wishes to put baby to breast. Enc mom to pump every 2-3 hours in order to protect her milk supply. Enc giving EBM first, then supplementing with formula as needed. Discussed progression of milk coming to volume, and engorgement prevention/treatment. Mom reports that she has a DEBP, and is not interested in renting a hospital-grade pump. Enc mom to hand express after pumping.   Maternal Data    Feeding Feeding Type: Formula  [facility]                      Lactation Tools Discussed/Used     Consult Status      Sherlyn HayJennifer D Germain Koopmann 02/19/2016, 7:35 PM

## 2016-02-21 ENCOUNTER — Emergency Department (HOSPITAL_COMMUNITY): Payer: Managed Care, Other (non HMO)

## 2016-02-21 ENCOUNTER — Inpatient Hospital Stay (HOSPITAL_COMMUNITY)
Admission: EM | Admit: 2016-02-21 | Discharge: 2016-02-23 | DRG: 776 | Disposition: A | Discharge status: Home / Self Care | Payer: Managed Care, Other (non HMO) | Attending: Emergency Medicine | Admitting: Emergency Medicine

## 2016-02-21 ENCOUNTER — Encounter (HOSPITAL_COMMUNITY): Payer: Self-pay

## 2016-02-21 DIAGNOSIS — O1493 Unspecified pre-eclampsia, third trimester: Secondary | ICD-10-CM

## 2016-02-21 DIAGNOSIS — O133 Gestational [pregnancy-induced] hypertension without significant proteinuria, third trimester: Secondary | ICD-10-CM

## 2016-02-21 DIAGNOSIS — I169 Hypertensive crisis, unspecified: Secondary | ICD-10-CM

## 2016-02-21 DIAGNOSIS — I5030 Unspecified diastolic (congestive) heart failure: Secondary | ICD-10-CM | POA: Diagnosis present

## 2016-02-21 DIAGNOSIS — J9601 Acute respiratory failure with hypoxia: Secondary | ICD-10-CM

## 2016-02-21 DIAGNOSIS — Z87891 Personal history of nicotine dependence: Secondary | ICD-10-CM

## 2016-02-21 DIAGNOSIS — F329 Major depressive disorder, single episode, unspecified: Secondary | ICD-10-CM | POA: Diagnosis present

## 2016-02-21 DIAGNOSIS — J810 Acute pulmonary edema: Secondary | ICD-10-CM

## 2016-02-21 DIAGNOSIS — O1490 Unspecified pre-eclampsia, unspecified trimester: Secondary | ICD-10-CM | POA: Diagnosis present

## 2016-02-21 DIAGNOSIS — O119 Pre-existing hypertension with pre-eclampsia, unspecified trimester: Secondary | ICD-10-CM

## 2016-02-21 DIAGNOSIS — O1415 Severe pre-eclampsia, complicating the puerperium: Principal | ICD-10-CM | POA: Diagnosis present

## 2016-02-21 DIAGNOSIS — E669 Obesity, unspecified: Secondary | ICD-10-CM | POA: Diagnosis present

## 2016-02-21 DIAGNOSIS — Z8249 Family history of ischemic heart disease and other diseases of the circulatory system: Secondary | ICD-10-CM

## 2016-02-21 DIAGNOSIS — R05 Cough: Secondary | ICD-10-CM | POA: Diagnosis present

## 2016-02-21 DIAGNOSIS — Z6836 Body mass index (BMI) 36.0-36.9, adult: Secondary | ICD-10-CM

## 2016-02-21 DIAGNOSIS — I5033 Acute on chronic diastolic (congestive) heart failure: Secondary | ICD-10-CM

## 2016-02-21 DIAGNOSIS — Z91048 Other nonmedicinal substance allergy status: Secondary | ICD-10-CM

## 2016-02-21 DIAGNOSIS — Z8632 Personal history of gestational diabetes: Secondary | ICD-10-CM

## 2016-02-21 DIAGNOSIS — R0602 Shortness of breath: Secondary | ICD-10-CM | POA: Diagnosis present

## 2016-02-21 LAB — URINALYSIS, ROUTINE W REFLEX MICROSCOPIC
Bilirubin Urine: NEGATIVE
Glucose, UA: NEGATIVE mg/dL
Ketones, ur: NEGATIVE mg/dL
Nitrite: NEGATIVE
Protein, ur: NEGATIVE mg/dL
Specific Gravity, Urine: 1.01 (ref 1.005–1.030)
pH: 6 (ref 5.0–8.0)

## 2016-02-21 LAB — BASIC METABOLIC PANEL
Anion gap: 8 (ref 5–15)
Anion gap: 9 (ref 5–15)
BUN: 5 mg/dL — ABNORMAL LOW (ref 6–20)
BUN: <5 mg/dL — ABNORMAL LOW (ref 6–20)
CO2: 24 mmol/L (ref 22–32)
CO2: 25 mmol/L (ref 22–32)
Calcium: 8.3 mg/dL — ABNORMAL LOW (ref 8.9–10.3)
Calcium: 8.3 mg/dL — ABNORMAL LOW (ref 8.9–10.3)
Chloride: 107 mmol/L (ref 101–111)
Chloride: 105 mmol/L (ref 101–111)
Creatinine, Ser: 0.41 mg/dL — ABNORMAL LOW (ref 0.44–1.00)
Creatinine, Ser: 0.61 mg/dL (ref 0.44–1.00)
GFR calc Af Amer: >60 mL/min (ref >60)
GFR calc Af Amer: >60 mL/min (ref >60)
GFR calc non Af Amer: >60 mL/min (ref >60)
GFR calc non Af Amer: >60 mL/min (ref >60)
Glucose, Bld: 76 mg/dL (ref 65–99)
Glucose, Bld: 94 mg/dL (ref 65–99)
Potassium: 3.5 mmol/L (ref 3.5–5.1)
Potassium: 3.1 mmol/L — ABNORMAL LOW (ref 3.5–5.1)
Sodium: 139 mmol/L (ref 135–145)
Sodium: 139 mmol/L (ref 135–145)

## 2016-02-21 LAB — BLOOD GAS, ARTERIAL
Acid-Base Excess: 1.3 mmol/L (ref 0.0–2.0)
Bicarbonate: 24.1 mmol/L (ref 20.0–28.0)
Drawn by: 33234
O2 Content: 2 L/min
O2 Saturation: 96 %
pCO2 arterial: 32.1 mmHg (ref 32.0–48.0)
pH, Arterial: 7.487 — ABNORMAL HIGH (ref 7.350–7.450)
pO2, Arterial: 83.8 mmHg (ref 83.0–108.0)

## 2016-02-21 LAB — PHOSPHORUS: Phosphorus: 4.7 mg/dL — ABNORMAL HIGH (ref 2.5–4.6)

## 2016-02-21 LAB — I-STAT TROPONIN, ED: Troponin i, poc: 0.05 ng/mL (ref 0.00–0.08)

## 2016-02-21 LAB — CBC
HCT: 29.1 % — ABNORMAL LOW (ref 36.0–46.0)
Hemoglobin: 9.3 g/dL — ABNORMAL LOW (ref 12.0–15.0)
MCH: 25.1 pg — ABNORMAL LOW (ref 26.0–34.0)
MCHC: 32 g/dL (ref 30.0–36.0)
MCV: 78.4 fL (ref 78.0–100.0)
Platelets: 271 10*3/uL (ref 150–400)
RBC: 3.71 MIL/uL — ABNORMAL LOW (ref 3.87–5.11)
RDW: 15.4 % (ref 11.5–15.5)
WBC: 11.3 10*3/uL — ABNORMAL HIGH (ref 4.0–10.5)

## 2016-02-21 LAB — MAGNESIUM: Magnesium: 2 mg/dL (ref 1.7–2.4)

## 2016-02-21 LAB — HEPATIC FUNCTION PANEL
ALT: 58 U/L — ABNORMAL HIGH (ref 14–54)
AST: 47 U/L — ABNORMAL HIGH (ref 15–41)
Albumin: 2.9 g/dL — ABNORMAL LOW (ref 3.5–5.0)
Alkaline Phosphatase: 130 U/L — ABNORMAL HIGH (ref 38–126)
Bilirubin, Direct: <0.1 mg/dL — ABNORMAL LOW (ref 0.1–0.5)
Total Bilirubin: 0.3 mg/dL (ref 0.3–1.2)
Total Protein: 6.1 g/dL — ABNORMAL LOW (ref 6.5–8.1)

## 2016-02-21 MED ORDER — IBUPROFEN 200 MG PO TABS: 600.0000 mg | ORAL_TABLET | Freq: Four times a day (QID) | ORAL | Status: DC

## 2016-02-21 MED ORDER — MAGNESIUM SULFATE 2 GM/50ML IV SOLN
2.0000 g | Freq: Once | INTRAVENOUS | Status: AC
  Administered 2016-02-21: 2 g via INTRAVENOUS
  Filled 2016-02-21: qty 50

## 2016-02-21 MED ORDER — FUROSEMIDE 10 MG/ML IJ SOLN
20.0000 mg | Freq: Once | INTRAMUSCULAR | Status: AC
  Administered 2016-02-21: 20 mg via INTRAVENOUS
  Filled 2016-02-21: qty 4

## 2016-02-21 MED ORDER — FUROSEMIDE 10 MG/ML IJ SOLN
40.0000 mg | Freq: Once | INTRAMUSCULAR | Status: AC
  Administered 2016-02-21: 40 mg via INTRAVENOUS
  Filled 2016-02-21: qty 4

## 2016-02-21 MED ORDER — HYDRALAZINE HCL 20 MG/ML IJ SOLN
10.0000 mg | INTRAMUSCULAR | Status: DC | PRN
  Administered 2016-02-22 (×2): 10 mg via INTRAVENOUS
  Administered 2016-02-23: 20 mg via INTRAVENOUS
  Filled 2016-02-21 (×3): qty 1

## 2016-02-21 MED ORDER — ACETAMINOPHEN 325 MG PO TABS
650.0000 mg | ORAL_TABLET | ORAL | Status: DC | PRN
  Administered 2016-02-21 – 2016-02-22 (×2): 650 mg via ORAL
  Filled 2016-02-21 (×3): qty 2

## 2016-02-21 MED ORDER — SODIUM CHLORIDE 0.9% FLUSH
3.0000 mL | Freq: Two times a day (BID) | INTRAVENOUS | Status: DC
  Administered 2016-02-21: 3 mL via INTRAVENOUS

## 2016-02-21 MED ORDER — COCONUT OIL OIL: 1.0000 "application " | TOPICAL_OIL | Status: DC | PRN

## 2016-02-21 MED ORDER — SODIUM CHLORIDE 0.9% FLUSH: 3.0000 mL | INTRAVENOUS | Status: DC | PRN

## 2016-02-21 MED ORDER — PRENATAL MULTIVITAMIN CH
1.0000 | ORAL_TABLET | Freq: Every day | ORAL | Status: DC
  Administered 2016-02-22 – 2016-02-23 (×2): 1 via ORAL
  Filled 2016-02-21 (×2): qty 1

## 2016-02-21 MED ORDER — POTASSIUM CHLORIDE CRYS ER 20 MEQ PO TBCR
40.0000 meq | EXTENDED_RELEASE_TABLET | Freq: Once | ORAL | Status: AC
  Administered 2016-02-21: 40 meq via ORAL
  Filled 2016-02-21: qty 2

## 2016-02-21 MED ORDER — SODIUM CHLORIDE 0.9 % IV SOLN: INTRAVENOUS | Status: DC

## 2016-02-21 MED ORDER — HYDRALAZINE HCL 20 MG/ML IJ SOLN
10.0000 mg | Freq: Once | INTRAMUSCULAR | Status: AC
  Administered 2016-02-21: 10 mg via INTRAVENOUS
  Filled 2016-02-21: qty 1

## 2016-02-21 MED ORDER — SODIUM CHLORIDE 0.9 % IV SOLN: 250.0000 mL | INTRAVENOUS | Status: DC | PRN

## 2016-02-21 NOTE — Progress Notes (Signed)
eLink Physician-Brief Progress Note Patient Name: Carol Nichols DOB: December 13, 1990 MRN: [id number]   Date of Service  02/21/2016  HPI/Events of Note  Extensive d/w Dr Chestine Sporelark. Pt 26 yr old post part 3 days, now HTN, pulm edema distress.   Lasix has been given, now neg 1.7 liters May need NIMV  eICU Interventions  Move to cone  ABG Needs echo to r/o cardoimypapthy likley needs further lasix And repeat lytes Called [facility] to get bed and transfer If needed in future Dr Chestine Sporelark - [phone] I will assume care upon arrival     Intervention Category Intermediate Interventions: Respiratory distress - evaluation and management Evaluation Type: New Patient Evaluation  FEINSTEIN,DANIEL J. 02/21/2016, 7:09 PM

## 2016-02-21 NOTE — ED Notes (Signed)
[facility] called this Clinical research associatewriter and informed may be able to transfer pt in 30 minutes.

## 2016-02-21 NOTE — Progress Notes (Signed)
[facility] to pick up pt. Pt alert walked to bathroom vded pt. Sob remains on o2 at 2L in bathroom bp 154/94 alert breast pumped and dumped per pt request breath sounds diminshed at 30 [facility] [address]

## 2016-02-21 NOTE — ED Notes (Signed)
Carol Nichols aware of current oral temperature of 100.0 farenheit. Per Carol Nichols notify if goes above 100.4 farenheit.

## 2016-02-21 NOTE — ED Provider Notes (Signed)
[facility]-EMERGENCY DEPT Provider Note   CSN: [id number] Arrival date & time: 02/21/16  1150     History   Chief Complaint Chief Complaint  Patient presents with  . Shortness of Breath  . Cough  . Chest Pain    HPI Carol Nichols is a 26 y.o. female who presents with shortness of breath, cough, and central chest pain that started yesterday.  Chest pain is exacerbated by taking deep breaths and by coughing.  Pt states she has to take short shallow breaths to prevent chest pain.  Pt was admitted on 02/17/16 for labor induction for pregnancy induced hypertension.  Pt had an uncomplicated spontaneous vaginal delivery on 02/18/16 of one live viable female infant. Pt states she was diagnosed with PIH two weeks prior to induction and delivery.  Pt was discharged from hospital after delivery without medications for blood pressure.  Pt first noticed shortness of breath, cough and chest pain as she was walking out of the hospital yesterday after discharge.  Over night symptoms have gotten worse.  Pt has tried mucinex, vic vaporizer, cough drops with no relief of symptoms.  Pt states baby is doing well at home.   Pt denies hemoptysis, previous h/o PE or DVT. Pt denies asymmetric lower extremity swelling.   HPI  Past Medical History:  Diagnosis Date  . Anxiety   . Depression    hospitalized in August  . Mental disorder   . Pregnancy induced hypertension     Patient Active Problem List   Diagnosis Date Noted  . Preeclampsia 02/21/2016  . Pregnancy 02/18/2016  . Gestational hypertension w/o significant proteinuria in 3rd trimester 02/17/2016  . Severe episode of recurrent major depressive disorder, without psychotic features (HCC)   . Major depression 06/22/2015    Past Surgical History:  Procedure Laterality Date  . MOUTH SURGERY    . TOOTH EXTRACTION      OB History    Gravida Para Term Preterm AB Living   3 1 1   2 1    SAB TAB Ectopic Multiple Live Births   1 1   0 1       Home  Medications    Prior to Admission medications   Medication Sig Start Date End Date Taking? Authorizing Provider  acetaminophen (TYLENOL) 500 MG tablet Take 500 mg by mouth every 6 (six) hours as needed for headache.   Yes Historical Provider, MD  calcium carbonate (TUMS - DOSED IN MG ELEMENTAL CALCIUM) 500 MG chewable tablet Chew 2 tablets by mouth daily as needed for indigestion or heartburn.   Yes Historical Provider, MD    Family History Family History  Problem Relation Age of Onset  . Hypertension Mother     Social History Social History  Substance Use Topics  . Smoking status: Former Smoker    Packs/day: 0.25    Types: Cigarettes    Quit date: 07/15/2014  . Smokeless tobacco: Never Used  . Alcohol use No     Comment: socially     Allergies   Dust mite extract and Pollen extract   Review of Systems Review of Systems  Constitutional: Negative for appetite change, chills and fever.  HENT: Negative for congestion and sore throat.   Eyes: Negative for visual disturbance.  Respiratory: Positive for cough and shortness of breath. Negative for choking and chest tightness.   Cardiovascular: Positive for chest pain and leg swelling. Negative for palpitations.  Gastrointestinal: Negative for abdominal pain, constipation, diarrhea, nausea and vomiting.  Genitourinary:  Negative for difficulty urinating and hematuria.  Musculoskeletal: Negative for arthralgias.  Skin: Negative for rash and wound.  Neurological: Negative for dizziness, seizures, syncope, weakness, light-headedness, numbness and headaches.  Hematological: Does not bruise/bleed easily.  Psychiatric/Behavioral: Negative.      Physical Exam Updated Vital Signs BP 125/98 (BP Location: Left Arm)   Pulse 104   Temp 98.8 F (37.1 C) (Oral)   Resp (!) 28   LMP 06/05/2015 (Approximate)   SpO2 100%   Breastfeeding? Yes   Physical Exam  Constitutional: She is oriented to person, place, and time. She appears  well-developed and well-nourished. No distress.  Pt taking short shallow breaths, not able to speak in full sentences. Does not appear toxic or distressed.  HENT:  Head: Normocephalic and atraumatic.  Nose: Nose normal.  Mouth/Throat: Oropharynx is clear and moist. No oropharyngeal exudate.  Eyes: Conjunctivae and EOM are normal. Pupils are equal, round, and reactive to light.  Neck: Normal [place] of motion. Neck supple. No JVD present.  Cardiovascular: Normal rate, regular rhythm, normal heart sounds and intact distal pulses.   No murmur heard. 2+ bilateral pitting pretibial edema.  Pulmonary/Chest:  Increased breathing effort, patient taking rapid shallow breaths.  Decreased breath sounds at lower bases bilaterally.  No wheezing, rhonchi or rales bilaterally.   Abdominal: Soft. Bowel sounds are normal. She exhibits no distension and no mass. There is no tenderness. There is no rebound and no guarding.  Musculoskeletal: Normal [place] of motion. She exhibits no deformity.  Lymphadenopathy:    She has no cervical adenopathy.  Neurological: She is alert and oriented to person, place, and time. No sensory deficit.  Skin: Skin is warm and dry. Capillary refill takes less than 2 seconds.  Psychiatric: She has a normal mood and affect. Her behavior is normal. Judgment and thought content normal.  Nursing note and vitals reviewed.    ED Treatments / Results  Labs (all labs ordered are listed, but only abnormal results are displayed) Labs Reviewed  BASIC METABOLIC PANEL - Abnormal; Notable for the following:       Result Value   BUN 5 (*)    Creatinine, Ser 0.41 (*)    Calcium 8.3 (*)    All other components within normal limits  CBC - Abnormal; Notable for the following:    WBC 11.3 (*)    RBC 3.71 (*)    Hemoglobin 9.3 (*)    HCT 29.1 (*)    MCH 25.1 (*)    All other components within normal limits  HEPATIC FUNCTION PANEL - Abnormal; Notable for the following:    Total Protein 6.1  (*)    Albumin 2.9 (*)    AST 47 (*)    ALT 58 (*)    Alkaline Phosphatase 130 (*)    Bilirubin, Direct <0.1 (*)    All other components within normal limits  URINALYSIS, ROUTINE W REFLEX MICROSCOPIC - Abnormal; Notable for the following:    APPearance HAZY (*)    Hgb urine dipstick LARGE (*)    Leukocytes, UA LARGE (*)    Bacteria, UA MANY (*)    Squamous Epithelial / LPF 0-5 (*)    All other components within normal limits  I-STAT TROPOININ, ED    EKG  EKG Interpretation  Date/Time:  Monday February 21 2016 12:00:33 EST Ventricular Rate:  102 PR Interval:    QRS Duration: 87 QT Interval:  332 QTC Calculation: 433 R Axis:   43 Text Interpretation:  Sinus  tachycardia Left atrial enlargement Since last tracing rate faster Confirmed by [facility]  MD, ELLIOTT [phone]) on 02/21/2016 1:41:03 PM       Radiology Dg Chest 2 View  Result Date: 02/21/2016 CLINICAL DATA:  Patient with shortness of breath and centralized chest pain. EXAM: CHEST  2 VIEW COMPARISON:  None. FINDINGS: Enlarged cardiac and mediastinal contours. Interval development of diffuse bilateral consolidative pulmonary opacities. No definite pleural effusion or pneumothorax. Regional skeleton is unremarkable. IMPRESSION: Interval development of diffuse bilateral perihilar opacities which may represent pulmonary edema, hemorrhage or infection. Prominent cardiac contours. Electronically Signed   By: Annia Beltrew  Davis M.D.   On: 02/21/2016 12:39    Procedures Procedures (including critical care time)  Medications Ordered in ED Medications  furosemide (LASIX) injection 20 mg (20 mg Intravenous Given 02/21/16 1435)  hydrALAZINE (APRESOLINE) injection 10 mg (10 mg Intravenous Given 02/21/16 1541)     Initial Impression / Assessment and Plan / ED Course  I have reviewed the triage vital signs and the nursing notes.  Pertinent labs & imaging results that were available during my care of the patient were reviewed by me and considered in  my medical decision making (see chart for details).  Clinical Course as of Feb 20 1625  Mon Feb 21, 2016  1426 Pt discussed with Dr. Effie ShyWentz who evaluated pt and will [facility].  [CG]    Clinical Course User Index [CG] Liberty Handylaudia J Sudais Banghart, PA-C   Concerned for PE, heart failure, preeclampsia/eclampsia.  Pt tachypnic, SBP in 170s initially.  Pt desaturated to 79% while she was talking to me, quickly bounced back to 90% with deep breaths.  Patient speaking taking short shallow breaths.  Lung sounds decreased at lower bases bilaterally.    Mildly elevated LFTs.  CXR looks wet with diffuse bilateral opacities suspicious for pulmonary edema. EKG remarkable for LVH. CBC, BMP, troponin unremarkable.    Pt was given lasix and hydralazine in ED. Last SBP was 125.  Pt discussed with Dr. Effie ShyWentz who [facility].   Final Clinical Impressions(s) / ED Diagnoses   Final diagnoses:  Preeclampsia complicating hypertension    New Prescriptions New Prescriptions   No medications on file     Liberty HandyClaudia J Lloyd Cullinan, PA-C 02/21/16 1627    Mancel BaleElliott Wentz, MD 02/22/16 1740

## 2016-02-21 NOTE — ED Triage Notes (Addendum)
Pt presents w/ productive cough (clear in color), sob, and chest tightness that began on 12/31. Pt was d/c'd on 12/31 following her vaginal delivery on 12/29. Pt denies hx of asthma. Pt denies delivery complications. Pt reports pregnancy was normal w/ the exception of having PIH.

## 2016-02-21 NOTE — Progress Notes (Signed)
eLink Physician-Brief Progress Note Patient Name: Carol Nichols DOB: 05/24/1990 MRN: [id number]   Date of Service  02/21/2016  HPI/Events of Note  K+ = 3.1 and Creatinine = 0.61.  eICU Interventions  Will replace K+.     Intervention Category Intermediate Interventions: Electrolyte abnormality - evaluation and management  Samra Pesch Eugene 02/21/2016, 11:29 PM

## 2016-02-21 NOTE — ED Provider Notes (Signed)
  Face-to-face evaluation   History: Carol Nichols presents for worsening shortness of breath over the last 24 hours, associated with known hypertension. Carol Nichols is discharged from the hospital yesterday after hospitalization for pregnancy-induced hypertension requiring induction of labor. Carol Nichols was in the hospital 3 days and discharge, to home without treatment for hypertension or diaphoresis. Carol Nichols is currently breast-feeding. Carol Nichols denies weakness or dizziness. Carol Nichols complains of leg, bilateral swelling.  Physical exam: Alert, somewhat dyspneic, tachypneic. Decreased airflow in both lung fields. 2-3+ pitting edema lower legs, bilaterally.  14:45- Case discussed with OB/GYN- Dr. Chestine Nichols, who request that treatment initiated here, to stabilize the patient, then [facility].   Medications  magnesium sulfate IVPB 2 g 50 mL (not administered)  furosemide (LASIX) injection 20 mg (20 mg Intravenous Given 02/21/16 1435)  hydrALAZINE (APRESOLINE) injection 10 mg (10 mg Intravenous Given 02/21/16 1541)    Patient Vitals for the past 24 hrs:  BP Temp Temp src Pulse Resp SpO2  02/21/16 1623 125/98 98.8 F (37.1 C) Oral 104 (!) 28 100 %  02/21/16 1541 - 100 F (37.8 C) Oral - - -  02/21/16 1530 (!) 174/106 - - 92 (!) 27 97 %  02/21/16 1520 (!) 178/106 - - 98 25 98 %  02/21/16 1500 (!) 169/103 - - 94 17 97 %  02/21/16 1435 (!) 162/108 - - 102 (!) 28 100 %  02/21/16 1410 (!) 151/104 - - 100 (!) 32 92 %  02/21/16 1259 (!) 173/109 98.3 F (36.8 C) Oral 98 26 94 %  02/21/16 1156 162/96 98.9 F (37.2 C) Oral 106 24 97 %    2:52 PM Reevaluation with update and discussion. After initial assessment and treatment, an updated evaluation reveals Carol Nichols states that Carol Nichols feels better, but is still tachypneic, with respiratory rate of 38, and a blood pressure now improved at 147/87. Carol Nichols has voided 3 times.Carol Nichols. Carol Nichols   14:55, case discussed with her on-call gynecologist, who accepts the  patient [facility]. Holding orders written and transfer paperwork filled out.   CRITICAL CARE Performed by: Carol MelterWENTZ,Carol Nichols Total critical care time: 40 minutes Critical care time was exclusive of separately billable procedures and treating other patients. Critical care was necessary to treat or prevent imminent or life-threatening deterioration. Critical care was time spent personally by me on the following activities: development of treatment plan with patient and/or surrogate as well as nursing, discussions with consultants, evaluation of patient's response to treatment, examination of patient, obtaining history from patient or surrogate, ordering and performing treatments and interventions, ordering and review of laboratory studies, ordering and review of radiographic studies, pulse oximetry and re-evaluation of patient's condition.        Medical screening examination/treatment/procedure(s) were conducted as a shared visit with non-physician practitioner(s) and myself.  I personally evaluated the patient during the encounter   Carol BaleElliott Burke Terry, MD 02/22/16 1740

## 2016-02-21 NOTE — H&P (Signed)
PULMONARY / CRITICAL CARE MEDICINE   Name: Carol Nichols MRN: [id number] DOB: 05-01-1990    ADMISSION DATE:  02/21/2016 CONSULTATION DATE:  1/1  REFERRING MD:  Dr. Chestine Spore, [facility]  CHIEF COMPLAINT:  SOB  HISTORY OF PRESENT ILLNESS:   26 year old female with PMH as below, which is significant for major depression with past admissions for suicidal ideation. She is G3P1 after giving birth to a healthy full term daughter 12/29. She was admitted for 12/28 for induction due to hypertension, she did, however, have a spontaneous vaginal birth. She was reportedly somewhat hypertensive upon discharge, and returned to [facility] ED 1/1 with complaints of dyspnea. She was found to be hypertensive into the 170s. CXR in ED consistent with pulmonary edema. She was given 20mg  lasix and was able to diurese 1.7L. BPs normalized to 120s. She was transferred to [facility] where she remained markedly dyspneic. PCCM was contacted and she was transferred to [facility] for ICU admission.   PAST MEDICAL HISTORY :  She  has a past medical history of Anxiety; Depression; Mental disorder; and Pregnancy induced hypertension.  PAST SURGICAL HISTORY: She  has a past surgical history that includes Mouth surgery and Tooth extraction.  Allergies  Allergen Reactions  . Dust Mite Extract Other (See Comments) and Cough    Causes congestion, sore throat  . Pollen Extract Other (See Comments) and Cough    Causes congestion, sore throat    No current facility-administered medications on file prior to encounter.    Current Outpatient Prescriptions on File Prior to Encounter  Medication Sig  . acetaminophen (TYLENOL) 500 MG tablet Take 500 mg by mouth every 6 (six) hours as needed for headache.  . calcium carbonate (TUMS - DOSED IN MG ELEMENTAL CALCIUM) 500 MG chewable tablet Chew 2 tablets by mouth daily as needed for indigestion or heartburn.    FAMILY HISTORY:  Her indicated that her mother is alive. She  indicated that her father is alive. She indicated that her sister is alive. She indicated that her brother is alive. She indicated that her other is deceased.    SOCIAL HISTORY: She  reports that she quit smoking about 19 months ago. Her smoking use included Cigarettes. She smoked 0.25 packs per day. She has never used smokeless tobacco. She reports that she uses drugs, including Marijuana, about 7 times per week. She reports that she does not drink alcohol.  REVIEW OF SYSTEMS:   Bolds are positive  Constitutional: weight loss, gain, night sweats, Fevers, chills, fatigue .  HEENT: headaches, Sore throat, sneezing, nasal congestion, post nasal drip, Difficulty swallowing, Tooth/dental problems, visual complaints visual changes, ear ache CV:  chest pain, radiates: ,Orthopnea, PND, swelling in lower extremities L>R, dizziness, palpitations, syncope.  GI  heartburn, indigestion, abdominal pain, nausea, vomiting, diarrhea, change in bowel habits, loss of appetite, bloody stools.  Resp: cough, productive: clear frothy , hemoptysis, dyspnea, chest pain, pleuritic.  Skin: rash or itching or icterus GU: dysuria, change in color of urine, urgency or frequency. flank pain, hematuria  MS: joint pain or swelling. decreased [place] of motion  Psych: change in mood or affect. depression or anxiety.  Neuro: difficulty with speech, weakness, numbness, ataxia    SUBJECTIVE:    VITAL SIGNS: BP (!) 154/94   Pulse (!) 101   Temp 99 F (37.2 C) (Oral)   Resp (!) 30   Ht 5\' 5"  (1.651 m)   Wt 104.3 kg (230 lb)   LMP 06/05/2015 (Approximate)  SpO2 100%   Breastfeeding? Yes   BMI 38.27 kg/m   HEMODYNAMICS:    VENTILATOR SETTINGS:    INTAKE / OUTPUT: I/O last 3 completed shifts: In: -  Out: 1700 [Urine:1700]  PHYSICAL EXAMINATION: General:  Obese female with respiratory distress Neuro: Alert, oriented, non-focal HEENT:   [place]/AT, PERRL, no obvious JVD Cardiovascular:  [place]/AT, PERRL, RRR. BLE  edema (R trace, L +1) Lungs:  Crackles bibasilar Abdomen:  Soft, non-tender, non-distended Musculoskeletal:  No acute deformity Skin:  Grossly intact  LABS:  BMET  Recent Labs Lab 02/17/16 0120 02/21/16 1220  NA 133* 139  K 3.6 3.5  CL 103 107  CO2 23 24  BUN <5* 5*  CREATININE 0.34* 0.41*  GLUCOSE 81 76    Electrolytes  Recent Labs Lab 02/17/16 0120 02/21/16 1220  CALCIUM 9.0 8.3*    CBC  Recent Labs Lab 02/17/16 0120 02/17/16 0909 02/21/16 1220  WBC 8.4 9.0 11.3*  HGB 9.4* 9.1* 9.3*  HCT 30.2* 28.5* 29.1*  PLT 227 213 271    Coag's No results for input(s): APTT, INR in the last 168 hours.  Sepsis Markers No results for input(s): LATICACIDVEN, PROCALCITON, O2SATVEN in the last 168 hours.  ABG  Recent Labs Lab 02/21/16 1945  PHART 7.487*  PCO2ART 32.1  PO2ART 83.8    Liver Enzymes  Recent Labs Lab 02/17/16 0120 02/21/16 1220  AST 35 47*  ALT 16 58*  ALKPHOS 191* 130*  BILITOT 0.2* 0.3  ALBUMIN 3.0* 2.9*    Cardiac Enzymes No results for input(s): TROPONINI, PROBNP in the last 168 hours.  Glucose No results for input(s): GLUCAP in the last 168 hours.  Imaging Dg Chest 2 View  Result Date: 02/21/2016 CLINICAL DATA:  Patient with shortness of breath and centralized chest pain. EXAM: CHEST  2 VIEW COMPARISON:  None. FINDINGS: Enlarged cardiac and mediastinal contours. Interval development of diffuse bilateral consolidative pulmonary opacities. No definite pleural effusion or pneumothorax. Regional skeleton is unremarkable. IMPRESSION: Interval development of diffuse bilateral perihilar opacities which may represent pulmonary edema, hemorrhage or infection. Prominent cardiac contours. Electronically Signed   By: Annia Belt M.D.   On: 02/21/2016 12:39    STUDIES:  CXR admit > bilateral opacifications Echo 1/2 >>>  CULTURES:   ANTIBIOTICS:   SIGNIFICANT EVENTS:   LINES/TUBES:    ASSESSMENT / PLAN:  26 year old female  G3P1 who is now 3 days post-partum. Pregnancy complicated by HTN. Admitted 1/1 with hypertensive crisis and pulmonary edema as a result. DDX includes preeclampsia and peripartum cardiomyopathy.   Hypoxemic respiratory failure secondary to preeclampsia vs peripartum cardiomyopathy - Supplemental O2 to keep SpO2 > 92% - Would benefit from BiPAP if does not improve quickly with diuresis - Lasix 40mg  now (received 20mg  in ED), and reevaluate in AM - PRN hydralazine to keep SBP < - Start scheduled PO antihypertensives in AM - Assess troponin x 1 - Check BMP with extended lytes - Echocardiogram  Depression - No home meds on file, monitor  PPD#3 - Followed by Dr Chestine Spore - contact info in chart  Best Practice Diet: Cardiac VTE ppx: SCDs Code: Full Admit: ICU, hopefully can transfer to tele, hospitalist in AM  Joneen Roach, [facility] Pulmonology/Critical Care Pager [phone] or [phone]  02/21/2016 9:12 PM  26 yo female with first pregnancy.  Delivered on 02/18/16.  Developed HTN and then respiratory distress.  CXR shows pulmonary edema.  She was started on tx for HTN and diuresis.  She reports improvement in her symptoms.  Denies chest pain.  Had episode of vomiting during delivery, but does not recall aspirating.  Alert, sitting up in bed.  Speaks in full sentences.  No stridor.  Faint b/l crackles.  HR regular.  Abd soft.  1+ edema.  CXR - b/l ASD  CBC Latest Ref Rng & Units 02/22/2016 02/21/2016 02/17/2016  WBC 4.0 - 10.5 K/uL 10.3 11.3(H) 9.0  Hemoglobin 12.0 - 15.0 g/dL 7.8(G9.1(L) 9.5(A9.3(L) 2.1(H9.1(L)  Hematocrit 36.0 - 46.0 % 28.9(L) 29.1(L) 28.5(L)  Platelets 150 - 400 K/uL 265 271 213    CMP Latest Ref Rng & Units 02/22/2016 02/21/2016 02/21/2016  Glucose 65 - 99 mg/dL 69 94 76  BUN 6 - 20 mg/dL 5(L) <0(Q<5(L) 5(L)  Creatinine 0.44 - 1.00 mg/dL 6.570.53 8.460.61 9.62(X0.41(L)  Sodium 135 - 145 mmol/L 137 139 139  Potassium 3.5 - 5.1 mmol/L 3.7 3.1(L) 3.5  Chloride 101 - 111 mmol/L 105  105 107  CO2 22 - 32 mmol/L 25 25 24   Calcium 8.9 - 10.3 mg/dL 8.0(L) 8.3(L) 8.3(L)  Total Protein 6.5 - 8.1 g/dL - - 6.1(L)  Total Bilirubin 0.3 - 1.2 mg/dL - - 0.3  Alkaline Phos 38 - 126 U/L - - 130(H)  AST 15 - 41 U/L - - 47(H)  ALT 14 - 54 U/L - - 58(H)    Assessment/plan:  Acute hypoxic respiratory failure 2nd to pregnancy induced hypertension with acute pulmonary edema. - continue diuresis - f/u CXR, Echo - f/u CXR - add labetalol   Monitor in ICU today >> if stable and Echo unremarkable, then transfer out of ICU 1/03.  Coralyn HellingVineet Jeananne Bedwell, MD [facility]Critical [facility] 02/22/2016, 7:39 AM Pager:  [phone] After 3pm call: [phone]

## 2016-02-21 NOTE — H&P (Signed)
26 yo [id number] PPD# s/p SVD at 37 weeks for gestational hypertension presented to the [facility] with complaints of SOB, CP and cough.  She was discharged on 12/30 (PPD#1) on no meds with BPs 130-140s/80-90s and was asymptomatic.   On arrival to the [facility], she was found to be hypertensive in the 170/110s.  She had a negative troponin, normal cbc.  LFTs were mildly elevated.  CXR should pulmonary edema.  She diuresed 1.7L fluid prior to transfer.  She received 10 mg IV hydralazine and BPs normalized to the 120s/90s.  Prior to transfer she was reported to have no O2 requirement, though review of ED records shows that she was requiring 2L [place] and had an episode of spontaneous desat to the 80s.    On arrival here, she is tachypneic with RR 30s .  She is struggling to speak due to shortness of breath.   Is maintaining O2 sats on 2L [place], but with significant work of breathing.   She endorses continued cough, chest discomfort and orthopnea.   She denies headache / blurry vision / ruq pain.     Past Medical History:  Diagnosis Date  . Anxiety   . Depression    hospitalized in August  . Mental disorder   . Pregnancy induced hypertension     Past Surgical History:  Procedure Laterality Date  . MOUTH SURGERY    . TOOTH EXTRACTION      OB History  Gravida Para Term Preterm AB Living  3 1 1   2 1   SAB TAB Ectopic Multiple Live Births  1 1   0 1    # Outcome Date GA Lbr Len/2nd Weight Sex Delivery Anes PTL Lv  3 Term 02/18/16 [redacted]w[redacted]d 11:38 / 04:54 3.3 kg (7 lb 4.4 oz) F Vag-Spont EPI  LIV     Birth Comments: wnl  2 SAB           1 TAB              Birth Comments: System Generated. Please review and update pregnancy details.      Social History   Social History  . Marital status: Single    Spouse name: N/A  . Number of children: N/A  . Years of education: N/A   Occupational History  . Not on file.   Social History Main Topics  . Smoking status: Former Smoker    Packs/day: 0.25    Types: Cigarettes     Quit date: 07/15/2014  . Smokeless tobacco: Never Used  . Alcohol use No     Comment: socially  . Drug use:     Frequency: 7.0 times per week    Types: Marijuana  . Sexual activity: Yes    Birth control/ protection: None   Other Topics Concern  . Not on file   Social History Narrative  . No narrative on file   Dust mite extract and Pollen extract     Vitals:   02/21/16 1813 02/21/16 1850  BP: 139/87 (!) 156/105  Pulse: 100 98  Resp: (!) 25 (!) 34  Temp: 99 F (37.2 C)      General:   Unable to speak full sentences due to shortness of breath Cardiac: Tachycardic Lungs: decreased breath sounds at bases, increased work of breathing.   Abdomen:  soft, fundus firm Ex:  1+ edema b/l (decreased from prior to delivery)    A/P   26 y.o. [id number] PPD#3 from SVD with hypertension and pulmonary  edema DDX includes preeclampsia with pulmonary edema, PPCM, and PE.   Most likely PP preeclampsia with resultant pulmonary edema, but will need further evaluation for postpartum cardiomyopathy.  Pulmonary edema: Consult with Dr. Tyson AliasFeinstein, critical care, who [facility] given possible need for increased respiratory support.    Postpartum preeclampsia: [facility], would rule in for preeclampsia with severe features.  While 24 hours of magnesium sulfate would typically be initiated for seizure prophylaxis given the severe Gaiser BPs, it is reasonable to defer at this time to avoid worsening of pulmonary edema.    Postpartum care: bleeding minimal, requiring ibuprofen / tylenol for pain control.  Breast pump q3h.   [facility] Center[facility]ANNA GEFFEL [employer]

## 2016-02-21 NOTE — ED Notes (Signed)
Per [facility] wait to transfer could be until 1900. Patient and provider made aware. Per Gala Romneyoug via [facility] [place]  Terri [phone].

## 2016-02-22 ENCOUNTER — Inpatient Hospital Stay (HOSPITAL_COMMUNITY): Payer: Managed Care, Other (non HMO)

## 2016-02-22 LAB — BASIC METABOLIC PANEL
Anion gap: 7 (ref 5–15)
BUN: 5 mg/dL — ABNORMAL LOW (ref 6–20)
CO2: 25 mmol/L (ref 22–32)
Calcium: 8 mg/dL — ABNORMAL LOW (ref 8.9–10.3)
Chloride: 105 mmol/L (ref 101–111)
Creatinine, Ser: 0.53 mg/dL (ref 0.44–1.00)
GFR calc Af Amer: >60 mL/min (ref >60)
GFR calc non Af Amer: >60 mL/min (ref >60)
Glucose, Bld: 69 mg/dL (ref 65–99)
Potassium: 3.7 mmol/L (ref 3.5–5.1)
Sodium: 137 mmol/L (ref 135–145)

## 2016-02-22 LAB — CBC
HCT: 28.9 % — ABNORMAL LOW (ref 36.0–46.0)
Hemoglobin: 9.1 g/dL — ABNORMAL LOW (ref 12.0–15.0)
MCH: 24.5 pg — ABNORMAL LOW (ref 26.0–34.0)
MCHC: 31.5 g/dL (ref 30.0–36.0)
MCV: 77.9 fL — ABNORMAL LOW (ref 78.0–100.0)
Platelets: 265 10*3/uL (ref 150–400)
RBC: 3.71 MIL/uL — ABNORMAL LOW (ref 3.87–5.11)
RDW: 15.5 % (ref 11.5–15.5)
WBC: 10.3 10*3/uL (ref 4.0–10.5)

## 2016-02-22 LAB — PHOSPHORUS: Phosphorus: 5.1 mg/dL — ABNORMAL HIGH (ref 2.5–4.6)

## 2016-02-22 LAB — MAGNESIUM: Magnesium: 1.9 mg/dL (ref 1.7–2.4)

## 2016-02-22 LAB — TROPONIN I: Troponin I: 0.07 ng/mL (ref <0.03)

## 2016-02-22 LAB — MRSA PCR SCREENING: MRSA by PCR: NEGATIVE

## 2016-02-22 MED ORDER — OXYCODONE-ACETAMINOPHEN 5-325 MG PO TABS
1.0000 | ORAL_TABLET | ORAL | Status: DC | PRN
  Administered 2016-02-22 – 2016-02-23 (×3): 2 via ORAL
  Filled 2016-02-22 (×3): qty 2

## 2016-02-22 MED ORDER — LABETALOL HCL 100 MG PO TABS
100.0000 mg | ORAL_TABLET | Freq: Two times a day (BID) | ORAL | Status: DC
  Administered 2016-02-22 – 2016-02-23 (×3): 100 mg via ORAL
  Filled 2016-02-22 (×3): qty 1

## 2016-02-22 MED ORDER — FUROSEMIDE 10 MG/ML IJ SOLN
40.0000 mg | Freq: Once | INTRAMUSCULAR | Status: AC
  Administered 2016-02-22: 40 mg via INTRAVENOUS
  Filled 2016-02-22: qty 4

## 2016-02-22 NOTE — Progress Notes (Signed)
Patient transferred from 4N28 to 4N04, [facility]. VSS, A&Ox4, report of headache addressed with prn percocet per pt request. Will continue to monitor.

## 2016-02-23 ENCOUNTER — Inpatient Hospital Stay (HOSPITAL_COMMUNITY): Payer: Managed Care, Other (non HMO)

## 2016-02-23 DIAGNOSIS — J9601 Acute respiratory failure with hypoxia: Secondary | ICD-10-CM | POA: Diagnosis not present

## 2016-02-23 DIAGNOSIS — J810 Acute pulmonary edema: Secondary | ICD-10-CM | POA: Diagnosis not present

## 2016-02-23 DIAGNOSIS — R0600 Dyspnea, unspecified: Secondary | ICD-10-CM | POA: Diagnosis not present

## 2016-02-23 DIAGNOSIS — I5033 Acute on chronic diastolic (congestive) heart failure: Secondary | ICD-10-CM | POA: Diagnosis not present

## 2016-02-23 DIAGNOSIS — I169 Hypertensive crisis, unspecified: Secondary | ICD-10-CM | POA: Diagnosis not present

## 2016-02-23 DIAGNOSIS — O133 Gestational [pregnancy-induced] hypertension without significant proteinuria, third trimester: Secondary | ICD-10-CM

## 2016-02-23 LAB — ECHOCARDIOGRAM COMPLETE
Height: 65 in
Weight: 3531.2 oz

## 2016-02-23 LAB — MAGNESIUM: Magnesium: 1.9 mg/dL (ref 1.7–2.4)

## 2016-02-23 LAB — COMPREHENSIVE METABOLIC PANEL
ALT: 41 U/L (ref 14–54)
AST: 22 U/L (ref 15–41)
Albumin: 2.1 g/dL — ABNORMAL LOW (ref 3.5–5.0)
Alkaline Phosphatase: 101 U/L (ref 38–126)
Anion gap: 7 (ref 5–15)
BUN: 7 mg/dL (ref 6–20)
CO2: 26 mmol/L (ref 22–32)
Calcium: 8.3 mg/dL — ABNORMAL LOW (ref 8.9–10.3)
Chloride: 106 mmol/L (ref 101–111)
Creatinine, Ser: 0.58 mg/dL (ref 0.44–1.00)
GFR calc Af Amer: >60 mL/min (ref >60)
GFR calc non Af Amer: >60 mL/min (ref >60)
Glucose, Bld: 64 mg/dL — ABNORMAL LOW (ref 65–99)
Potassium: 3.5 mmol/L (ref 3.5–5.1)
Sodium: 139 mmol/L (ref 135–145)
Total Bilirubin: 0.4 mg/dL (ref 0.3–1.2)
Total Protein: 5.3 g/dL — ABNORMAL LOW (ref 6.5–8.1)

## 2016-02-23 LAB — CBC
HCT: 28.6 % — ABNORMAL LOW (ref 36.0–46.0)
Hemoglobin: 8.8 g/dL — ABNORMAL LOW (ref 12.0–15.0)
MCH: 24.3 pg — ABNORMAL LOW (ref 26.0–34.0)
MCHC: 30.8 g/dL (ref 30.0–36.0)
MCV: 79 fL (ref 78.0–100.0)
Platelets: 262 10*3/uL (ref 150–400)
RBC: 3.62 MIL/uL — ABNORMAL LOW (ref 3.87–5.11)
RDW: 15.6 % — ABNORMAL HIGH (ref 11.5–15.5)
WBC: 8.1 10*3/uL (ref 4.0–10.5)

## 2016-02-23 MED ORDER — PRENATAL MULTIVITAMIN CH: 1.0000 | ORAL_TABLET | Freq: Every day | ORAL | 0 refills | Status: DC

## 2016-02-23 MED ORDER — LABETALOL HCL 100 MG PO TABS: 100.0000 mg | ORAL_TABLET | Freq: Two times a day (BID) | ORAL | 0 refills | Status: DC

## 2016-02-23 NOTE — Progress Notes (Signed)
  Echocardiogram 2D Echocardiogram has been performed.  Carol Nichols 02/23/2016, 8:43 AM

## 2016-02-23 NOTE — Discharge Summary (Signed)
Physician Discharge Summary  Patient ID: Carol Nichols MRN: [id number] DOB/AGE: [birth year] 26 y.o.  Admit date: 02/21/2016 Discharge date: 02/23/2016    Discharge Diagnoses:  Hypertensive Crisis  Acute Hypoxic Respiratory Failure secondary to Diastolic Heart Failure  Preeclampsia  Major Depression                                                                      DISCHARGE PLAN BY DIAGNOSIS     Hypertensive Crisis  Discharge Plan: Continue Labetalol 100 mg BID  Follow up with Cardiology on 1/17 at 1400  Acute Hypoxic Respiratory Failure secondary to Diastolic Heart Failure  Discharge Plan: Follow up appointment 1/5 0900 with [facility]  Follow up 1/17 1400 [facility]   Preeclampsia  Discharge Plan: Follow up with OB  Major Depression  Discharge Plan: Continue to follow up with Dr. Parke Poisson                   DISCHARGE SUMMARY   Carol Nichols is a 26 y.o. y/o female with a PMH of Major Depression (past admissions for SI) and Gestational HTN. Patient had vaginal delivery on 50/35 (which was complicated by Preeclampsia) and was discharged 12/30 with no medications, systolic 465-681 and asymptomatic. Presented to ED at [facility] on 1/1 with hypertensive crisis, (BP 120s/110s) with negative Troponin and Elevated LFT, CXR revealed pulmonary edema. Patient was given IV hydralazine and diuresed 1.7L, and then transferred to [facility]. Upon arrival patient was short of breath requiring supplemental oxygen and additional diuretic. During hospitalization patient was able to be diuresed to negative 3.2L and able to come off of supplemental oxygen as pleural effusions improved. An ECHO was completed revealing LV EF 55-60% with grade 2 diastolic dysfunction. Patient was placed on 100 mg Labetalol BID, with improvement of blood pressures.            SIGNIFICANT DIAGNOSTIC STUDIES Chest Xray 1/1 > Bilateral perihilar opacities which may represent pulmonary  edema  Echocardiogram 1/3 > LV EF 55-60% with grade 2 diastolic dysfunction, and mild LV hypertrophy.     Discharge Exam: General: Adult female, lying in bed, no distress  Neuro: Alert, follows commands, oriented  CV: RRR, no MRG  PULM: Clear breath sounds, non-labored  GI: non-tender, non-distended, active bowel sounds  Extremities: warm, dry, intact   Vitals:   02/23/16 0407 02/23/16 0827 02/23/16 1200 02/23/16 1229  BP: 132/77 (!) 140/95 (!) 153/106 (!) 168/101  Pulse: 88 91 83 83  Resp: 18 (!) 22 (!) 22 14  Temp: 98 F (36.7 C) 98.3 F (36.8 C)  97.9 F (36.6 C)  TempSrc: Oral Oral  Oral  SpO2: 100% 100% 100% 100%  Weight: 100.1 kg (220 lb 11.2 oz)     Height:         Discharge Labs  BMET  Recent Labs Lab 02/17/16 0120 02/21/16 1220 02/21/16 2150 02/22/16 0234 02/23/16 0334  NA 133* 139 139 137 139  K 3.6 3.5 3.1* 3.7 3.5  CL 103 107 105 105 106  CO2 '23 24 25 25 26  '$ GLUCOSE 81 76 94 69 64*  BUN <5* 5* <5* 5* 7  CREATININE 0.34* 0.41* 0.61 0.53 0.58  CALCIUM 9.0 8.3* 8.3* 8.0* 8.3*  MG  --   --  2.0 1.9 1.9  PHOS  --   --  4.7* 5.1*  --     CBC  Recent Labs Lab 02/21/16 1220 02/22/16 0234 02/23/16 0334  HGB 9.3* 9.1* 8.8*  HCT 29.1* 28.9* 28.6*  WBC 11.3* 10.3 8.1  PLT 271 265 262    Anti-Coagulation No results for input(s): INR in the last 168 hours.  Discharge Instructions    Diet - low sodium heart healthy    Complete by:  As directed        Follow-up Information    [facility] Follow up.   Specialty:  Radiology Contact information: [place] [id number] [place] [phone]       [facility] Follow up on 02/25/2016.   Specialty:  Pulmonology Why:  1/5 at 0900 Contact information: [place], PA-C Follow up on 03/08/2016.   Specialties:  Cardiology,  Radiology Why:  Please arrive at 2:00 PM to complete registration etc. before your appointment. Contact information: [address] [place] [address] [phone]            Allergies as of 02/23/2016      Reactions   Dust Mite Extract Other (See Comments), Cough   Causes congestion, sore throat   Pollen Extract Other (See Comments), Cough   Causes congestion, sore throat      Medication List    TAKE these medications   acetaminophen 500 MG tablet Commonly known as:  TYLENOL Take 500 mg by mouth every 6 (six) hours as needed for headache.   calcium carbonate 500 MG chewable tablet Commonly known as:  TUMS - dosed in mg elemental calcium Chew 2 tablets by mouth daily as needed for indigestion or heartburn.   labetalol 100 MG tablet Commonly known as:  NORMODYNE Take 1 tablet (100 mg total) by mouth 2 (two) times daily.   prenatal multivitamin Tabs tablet Take 1 tablet by mouth daily at 12 noon. Start taking on:  02/24/2016         Disposition: Discharge home with follow up appointments.    Discharged Condition: Brinlyn Even has met maximum benefit of inpatient care and is medically stable and cleared for discharge.  Patient is pending follow up as above.     Time spent on disposition:  Greater than 35 minutes.   Signed: Hayden Pedro, AG-ACNP [facility]  Pgr: [phone]  PCCM Pgr: [phone]

## 2016-02-23 NOTE — Progress Notes (Addendum)
PULMONARY / CRITICAL CARE MEDICINE  ADMISSION DATE:  02/21/2016 REFERRING MD:  Dr. Chestine Sp[facility]ark, [facility]  CHIEF COMPLAINT:  SOB  HISTORY OF PRESENT ILLNESS:   26 yo female with normal vaginal delivery 12/29.  Late term of pregnancy complicated by HTN.  Developed respiratory failure from pulmonary edema.  SUBJECTIVE:  Feels much better.  Off supplemental oxygen.  Walking in room w/o difficulty.  VITAL SIGNS: BP (!) 140/95 (BP Location: Left Arm)   Pulse 91   Temp 98.3 F (36.8 C) (Oral)   Resp (!) 22   Ht 5\' 5"  (1.651 m)   Wt 220 lb 11.2 oz (100.1 kg)   LMP 06/05/2015 (Approximate)   SpO2 100%   Breastfeeding? Yes   BMI 36.73 kg/m   INTAKE / OUTPUT: I/O last 3 completed shifts: In: 1860 [P.O.:1860] Out: 3450 [Urine:3450]  PHYSICAL EXAMINATION: General: pleasant Neuro: Alert, normal strength HEENT: no stridor Cardiovascular: regular, no murmur Lungs: no wheeze Abdomen:  Soft, non-tender Musculoskeletal: no edema Skin: no rashes   CMP Latest Ref Rng & Units 02/23/2016 02/22/2016 02/21/2016  Glucose 65 - 99 mg/dL 44(I64(L) 69 94  BUN 6 - 20 mg/dL 7 5(L) <3(K<5(L)  Creatinine 0.44 - 1.00 mg/dL 7.420.58 5.950.53 6.380.61  Sodium 135 - 145 mmol/L 139 137 139  Potassium 3.5 - 5.1 mmol/L 3.5 3.7 3.1(L)  Chloride 101 - 111 mmol/L 106 105 105  CO2 22 - 32 mmol/L 26 25 25   Calcium 8.9 - 10.3 mg/dL 8.3(L) 8.0(L) 8.3(L)  Total Protein 6.5 - 8.1 g/dL 5.3(L) - -  Total Bilirubin 0.3 - 1.2 mg/dL 0.4 - -  Alkaline Phos 38 - 126 U/L 101 - -  AST 15 - 41 U/L 22 - -  ALT 14 - 54 U/L 41 - -    CBC Latest Ref Rng & Units 02/23/2016 02/22/2016 02/21/2016  WBC 4.0 - 10.5 K/uL 8.1 10.3 11.3(H)  Hemoglobin 12.0 - 15.0 g/dL 7.5(I8.8(L) 4.3(P9.1(L) 2.9(J9.3(L)  Hematocrit 36.0 - 46.0 % 28.6(L) 28.9(L) 29.1(L)  Platelets 150 - 400 K/uL 262 265 271    ABG    Component Value Date/Time   PHART 7.487 (H) 02/21/2016 1945   PCO2ART 32.1 02/21/2016 1945   PO2ART 83.8 02/21/2016 1945   HCO3 24.1 02/21/2016 1945   O2SAT 96.0  02/21/2016 1945    IMAGING: Dg Chest 2 View  Result Date: 02/21/2016 CLINICAL DATA:  Patient with shortness of breath and centralized chest pain. EXAM: CHEST  2 VIEW COMPARISON:  None. FINDINGS: Enlarged cardiac and mediastinal contours. Interval development of diffuse bilateral consolidative pulmonary opacities. No definite pleural effusion or pneumothorax. Regional skeleton is unremarkable. IMPRESSION: Interval development of diffuse bilateral perihilar opacities which may represent pulmonary edema, hemorrhage or infection. Prominent cardiac contours. Electronically Signed   By: Annia Beltrew  Davis M.D.   On: 02/21/2016 12:39   Dg Chest Port 1 View  Result Date: 02/22/2016 CLINICAL DATA:  Pulmonary edema and shortness of breath. EXAM: PORTABLE CHEST 1 VIEW COMPARISON:  Two-view chest x-ray 02/21/2016 FINDINGS: The heart is enlarged. Bilateral interstitial and airspace disease is similar to the prior exam. There is no pneumothorax. No significant effusions are present. IMPRESSION: 1. Bilateral interstitial and airspace disease compatible with pneumonia. Edema or hemorrhage are also considered. There is no significant interval change. Electronically Signed   By: Marin Robertshristopher  Mattern M.D.   On: 02/22/2016 06:45    ASSESSMENT / PLAN:  Acute hypoxic respiratory failure 2nd to pregnancy induced hypertension with acute pulmonary edema. - much improved -  f/u Echo results - continue labetalol  If Echo results unrevealing, then likely d/c home later today.  Coralyn Helling, MD [facility] Pulmonary/Critical Care 02/23/2016, 10:18 AM Pager:  [phone] After 3pm call: [phone]  Echo 02/23/16 >> EF 55 to 60%, grade 2 diastolic DD  Okay for d/c home.  Continue labetalol.  Will schedule outpt f/u with [facility] 1 week and then she will need referral to cardiology to manage diastolic CHF.  Coralyn Helling, MD [facility] Pulmonary/Critical Care 02/23/2016, 4:04 PM Pager:  [phone] After 3pm call:  [phone]

## 2016-02-25 ENCOUNTER — Ambulatory Visit (INDEPENDENT_AMBULATORY_CARE_PROVIDER_SITE_OTHER)
Admission: RE | Admit: 2016-02-25 | Discharge: 2016-02-25 | Disposition: A | Discharge status: Home / Self Care | Payer: Managed Care, Other (non HMO) | Source: Ambulatory Visit | Attending: Adult Health | Admitting: Adult Health

## 2016-02-25 ENCOUNTER — Ambulatory Visit (INDEPENDENT_AMBULATORY_CARE_PROVIDER_SITE_OTHER): Payer: Managed Care, Other (non HMO) | Admitting: Adult Health

## 2016-02-25 ENCOUNTER — Other Ambulatory Visit (INDEPENDENT_AMBULATORY_CARE_PROVIDER_SITE_OTHER): Payer: Managed Care, Other (non HMO)

## 2016-02-25 ENCOUNTER — Encounter: Payer: Self-pay | Admitting: Adult Health

## 2016-02-25 VITALS — BP 132/74 | HR 87 | Ht 65.0 in | Wt 220.0 lb

## 2016-02-25 DIAGNOSIS — I169 Hypertensive crisis, unspecified: Secondary | ICD-10-CM

## 2016-02-25 DIAGNOSIS — J810 Acute pulmonary edema: Secondary | ICD-10-CM

## 2016-02-25 DIAGNOSIS — I5033 Acute on chronic diastolic (congestive) heart failure: Secondary | ICD-10-CM | POA: Diagnosis not present

## 2016-02-25 LAB — CBC WITH DIFFERENTIAL/PLATELET
Basophils Absolute: 0 10*3/uL (ref 0.0–0.1)
Basophils Relative: 0.7 % (ref 0.0–3.0)
Eosinophils Absolute: 0.3 10*3/uL (ref 0.0–0.7)
Eosinophils Relative: 5 % (ref 0.0–5.0)
HCT: 29.1 % — ABNORMAL LOW (ref 36.0–46.0)
Hemoglobin: 9.4 g/dL — ABNORMAL LOW (ref 12.0–15.0)
Lymphocytes Relative: 29.8 % (ref 12.0–46.0)
Lymphs Abs: 1.9 10*3/uL (ref 0.7–4.0)
MCHC: 32.3 g/dL (ref 30.0–36.0)
MCV: 77.5 fl — ABNORMAL LOW (ref 78.0–100.0)
Monocytes Absolute: 0.7 10*3/uL (ref 0.1–1.0)
Monocytes Relative: 10.4 % (ref 3.0–12.0)
Neutro Abs: 3.4 10*3/uL (ref 1.4–7.7)
Neutrophils Relative %: 54.1 % (ref 43.0–77.0)
Platelets: 327 10*3/uL (ref 150.0–400.0)
RBC: 3.76 Mil/uL — ABNORMAL LOW (ref 3.87–5.11)
RDW: 16.5 % — ABNORMAL HIGH (ref 11.5–15.5)
WBC: 6.3 10*3/uL (ref 4.0–10.5)

## 2016-02-25 LAB — BASIC METABOLIC PANEL
BUN: 6 mg/dL (ref 6–23)
CO2: 29 mEq/L (ref 19–32)
Calcium: 8.8 mg/dL (ref 8.4–10.5)
Chloride: 106 mEq/L (ref 96–112)
Creatinine, Ser: 0.56 mg/dL (ref 0.40–1.20)
GFR: 167.49 mL/min (ref >60.00)
Glucose, Bld: 63 mg/dL — ABNORMAL LOW (ref 70–99)
Potassium: 4 mEq/L (ref 3.5–5.1)
Sodium: 141 mEq/L (ref 135–145)

## 2016-02-25 NOTE — Assessment & Plan Note (Signed)
Resolved decompensation  Cont on current regimen  follow up cardiology as planned

## 2016-02-25 NOTE — Patient Instructions (Addendum)
Continue on current regimen .  Chest xray and labs today .  Check with OB and Pediatrician regarding nursing (labetolol use)  Establish with Primary MD -can call [phone].  Follow up with Cardiology as planned this month.  Follow up with Dr. Craige CottaSood  In 2-3 months and As needed   Please contact office for sooner follow up if symptoms do not improve or worsen or seek emergency care

## 2016-02-25 NOTE — Assessment & Plan Note (Signed)
Pregnancy induced HTN w/ pulmonary edema resolved w/ diuresis and HTN rx.  She is much improved  Will follow up on labs and cxr today  She will need to keep  follow up with cardiology and OB  Establish with PCP   Plan  Patient Instructions  Continue on current regimen .  Chest xray and labs today .  Check with OB and Pediatrician regarding nursing (labetolol use)  Establish with Primary MD -can call [phone].  Follow up with Cardiology as planned this month.  Follow up with Dr. Craige CottaSood  In 2-3 months and As needed   Please contact office for sooner follow up if symptoms do not improve or worsen or seek emergency care

## 2016-02-25 NOTE — Progress Notes (Signed)
I have reviewed and agree with assessment/plan.  Coralyn HellingVineet Meili Kleckley, MD [facility] 02/25/2016, 12:50 PM Pager:  [phone]

## 2016-02-25 NOTE — Progress Notes (Signed)
$'@Patient'K$  ID: Carol Nichols, female    DOB: 01/14/1991, 26 y.o.   MRN: [id number]  Chief Complaint  Patient presents with  . Hospitalization Follow-up    Referring provider: No ref. provider found  HPI: 26 year old female seen for pulmonary critical care consult during hospitalization 02/21/16  After normal vaginal delivery 12/29 with pregnancy induced HTN with acute pulmonary edema    TEST /Events  Echo 02/23/16 EF 60-65%, Gr 2 DD   02/25/2016 Follow up : Post hospital follow up pulmonary edema /HTN Patient returns for a post hospital follow-up. Patient was admitted January 1 to 02/23/2016 for pregnancy-induced hypertension with acute pulmonary edema. Patient had a normal vaginal delivery on December 29. She developed acute shortness of breath and return to the emergency room on January 1. She was found to have primary edema on chest x-ray. Patient was treated with diuretics. And started on labetalol Patient says since discharge she is doing much better. She has decreased shortness of breath. She has no leg swelling or orthopnea. Echo did show grade 2 diastolic dysfunction. She has been referred to cardiology with an upcoming appointment in 2 weeks. She has an upcoming appointment with her OB. She is currently not breastfeeding , she is pumping . We discussed contacting OB /Pediatrician to check on labetolol and nursing .  She denies chest pain , edema or hemoptysis .    Allergies  Allergen Reactions  . Dust Mite Extract Other (See Comments) and Cough    Causes congestion, sore throat  . Pollen Extract Other (See Comments) and Cough    Causes congestion, sore throat    Immunization History  Administered Date(s) Administered  . Influenza Split 01/25/2016  . MMR 02/19/2016    Past Medical History:  Diagnosis Date  . Anxiety   . Depression    hospitalized in August  . Mental disorder   . Pregnancy induced hypertension     Tobacco History: History  Smoking Status  . Former  Smoker  . Packs/day: 0.25  . Types: Cigarettes  . Quit date: 07/15/2014  Smokeless Tobacco  . Never Used   Counseling given: Not Answered   Outpatient Encounter Prescriptions as of 02/25/2016  Medication Sig  . acetaminophen (TYLENOL) 500 MG tablet Take 500 mg by mouth every 6 (six) hours as needed for headache.  . calcium carbonate (TUMS - DOSED IN MG ELEMENTAL CALCIUM) 500 MG chewable tablet Chew 2 tablets by mouth daily as needed for indigestion or heartburn.  . labetalol (NORMODYNE) 100 MG tablet Take 1 tablet (100 mg total) by mouth 2 (two) times daily.  . Prenatal Vit-Fe Fumarate-FA (PRENATAL MULTIVITAMIN) TABS tablet Take 1 tablet by mouth daily at 12 noon.  . [DISCONTINUED] 0.9 %  sodium chloride infusion   . [DISCONTINUED] acetaminophen (TYLENOL) tablet 650 mg   . [DISCONTINUED] coconut oil   . [DISCONTINUED] hydrALAZINE (APRESOLINE) injection 10-20 mg   . [DISCONTINUED] labetalol (NORMODYNE) tablet 100 mg   . [DISCONTINUED] oxyCODONE-acetaminophen (PERCOCET/ROXICET) 5-325 MG per tablet 1-2 tablet   . [DISCONTINUED] prenatal multivitamin tablet 1 tablet    No facility-administered encounter medications on file as of 02/25/2016.      Review of Systems  Constitutional:   No  weight loss, night sweats,  Fevers, chills, fatigue, or  lassitude.  HEENT:   No headaches,  Difficulty swallowing,  Tooth/dental problems, or  Sore throat,                No sneezing, itching, ear ache, nasal congestion,  post nasal drip,   CV:  No chest pain,  Orthopnea, PND, swelling in lower extremities, anasarca, dizziness, palpitations, syncope.   GI  No heartburn, indigestion, abdominal pain, nausea, vomiting, diarrhea, change in bowel habits, loss of appetite, bloody stools.   Resp: No shortness of breath with exertion or at rest.  No excess mucus, no productive cough,  No non-productive cough,  No coughing up of blood.  No change in color of mucus.  No wheezing.  No chest wall deformity  Skin:  no rash or lesions.  GU: no dysuria, change in color of urine, no urgency or frequency.  No flank pain, no hematuria   MS:  No joint pain or swelling.  No decreased [place] of motion.  No back pain.    Physical Exam  BP 132/74 (BP Location: Right Arm, Cuff Size: Normal)   Pulse 87   Ht '5\' 5"'$  (1.651 m)   Wt 220 lb (99.8 kg)   LMP 06/05/2015 (Approximate)   SpO2 98%   BMI 36.61 kg/m   GEN: A/Ox3; pleasant , NAD, well nourished    HEENT:  [place]/AT,  EACs-clear, TMs-wnl, NOSE-clear, THROAT-clear, no lesions, no postnasal drip or exudate noted.   NECK:  Supple w/ fair ROM; no JVD; normal carotid impulses w/o bruits; no thyromegaly or nodules palpated; no lymphadenopathy.    RESP  Clear  P & A; w/o, wheezes/ rales/ or rhonchi. no accessory muscle use, no dullness to percussion  CARD:  RRR, no m/r/g, no peripheral edema, pulses intact, no cyanosis or clubbing.  GI:   Soft & nt; nml bowel sounds; no organomegaly or masses detected.   Musco: Warm bil, no deformities or joint swelling noted.   Neuro: alert, no focal deficits noted.    Skin: Warm, no lesions or rashes  Psych:  No change in mood or affect. No depression or anxiety.  No memory loss.  Lab Results:  CBC    Component Value Date/Time   WBC 8.1 02/23/2016 0334   RBC 3.62 (L) 02/23/2016 0334   HGB 8.8 (L) 02/23/2016 0334   HCT 28.6 (L) 02/23/2016 0334   PLT 262 02/23/2016 0334   MCV 79.0 02/23/2016 0334   MCH 24.3 (L) 02/23/2016 0334   MCHC 30.8 02/23/2016 0334   RDW 15.6 (H) 02/23/2016 0334    BMET    Component Value Date/Time   NA 139 02/23/2016 0334   K 3.5 02/23/2016 0334   CL 106 02/23/2016 0334   CO2 26 02/23/2016 0334   GLUCOSE 64 (L) 02/23/2016 0334   BUN 7 02/23/2016 0334   CREATININE 0.58 02/23/2016 0334   CALCIUM 8.3 (L) 02/23/2016 0334   GFRNONAA >60 02/23/2016 0334   GFRAA >60 02/23/2016 0334    BNP No results found for: BNP  ProBNP No results found for: PROBNP  Imaging: Dg Chest 2  View  Result Date: 02/25/2016 CLINICAL DATA:  Pulmonary edema. EXAM: CHEST  2 VIEW COMPARISON:  02/22/2016 FINDINGS: Cardiopericardial silhouette remains enlarged. The interstitial and central airspace disease seen previously has resolved completely in the interval. The visualized bony structures of the thorax are intact. IMPRESSION: Enlargement of the cardiopericardial silhouette. Otherwise normal exam. Electronically Signed   By: Misty Stanley M.D.   On: 02/25/2016 10:57   Dg Chest 2 View  Result Date: 02/21/2016 CLINICAL DATA:  Patient with shortness of breath and centralized chest pain. EXAM: CHEST  2 VIEW COMPARISON:  None. FINDINGS: Enlarged cardiac and mediastinal contours. Interval development of diffuse bilateral  consolidative pulmonary opacities. No definite pleural effusion or pneumothorax. Regional skeleton is unremarkable. IMPRESSION: Interval development of diffuse bilateral perihilar opacities which may represent pulmonary edema, hemorrhage or infection. Prominent cardiac contours. Electronically Signed   By: Lovey Newcomer M.D.   On: 02/21/2016 12:39   Dg Chest Port 1 View  Result Date: 02/22/2016 CLINICAL DATA:  Pulmonary edema and shortness of breath. EXAM: PORTABLE CHEST 1 VIEW COMPARISON:  Two-view chest x-ray 02/21/2016 FINDINGS: The heart is enlarged. Bilateral interstitial and airspace disease is similar to the prior exam. There is no pneumothorax. No significant effusions are present. IMPRESSION: 1. Bilateral interstitial and airspace disease compatible with pneumonia. Edema or hemorrhage are also considered. There is no significant interval change. Electronically Signed   By: San Morelle M.D.   On: 02/22/2016 06:45     Assessment & Plan:   Hypertensive crisis Pregnancy induced HTN w/ pulmonary edema resolved w/ diuresis and HTN rx.  She is much improved  Will follow up on labs and cxr today  She will need to keep  follow up with cardiology and OB  Establish with PCP     Plan  Patient Instructions  Continue on current regimen .  Chest xray and labs today .  Check with OB and Pediatrician regarding nursing (labetolol use)  Establish with Primary MD -can call [phone].  Follow up with Cardiology as planned this month.  Follow up with Dr. Halford Chessman  In 2-3 months and As needed   Please contact office for sooner follow up if symptoms do not improve or worsen or seek emergency care      Acute on chronic diastolic heart failure ([place]) Resolved decompensation  Cont on current regimen  follow up cardiology as planned      Rexene Edison, NP 02/25/2016

## 2016-03-08 ENCOUNTER — Ambulatory Visit: Payer: Managed Care, Other (non HMO) | Admitting: Cardiology

## 2016-03-10 ENCOUNTER — Encounter: Payer: Self-pay | Admitting: *Deleted

## 2016-03-24 ENCOUNTER — Ambulatory Visit: Payer: Managed Care, Other (non HMO) | Admitting: Cardiology

## 2016-03-29 ENCOUNTER — Encounter: Payer: Self-pay | Admitting: Cardiology

## 2018-03-16 ENCOUNTER — Emergency Department (HOSPITAL_COMMUNITY)
Admission: EM | Admit: 2018-03-16 | Discharge: 2018-03-16 | Disposition: A | Discharge status: Home / Self Care | Payer: Self-pay | Attending: Emergency Medicine | Admitting: Emergency Medicine

## 2018-03-16 ENCOUNTER — Encounter (HOSPITAL_COMMUNITY): Payer: Self-pay | Admitting: Emergency Medicine

## 2018-03-16 DIAGNOSIS — K047 Periapical abscess without sinus: Secondary | ICD-10-CM | POA: Insufficient documentation

## 2018-03-16 DIAGNOSIS — K029 Dental caries, unspecified: Secondary | ICD-10-CM | POA: Insufficient documentation

## 2018-03-16 DIAGNOSIS — Z87891 Personal history of nicotine dependence: Secondary | ICD-10-CM | POA: Insufficient documentation

## 2018-03-16 MED ORDER — AMOXICILLIN-POT CLAVULANATE 875-125 MG PO TABS: 1.0000 | ORAL_TABLET | Freq: Two times a day (BID) | ORAL | 0 refills | Status: DC

## 2018-03-16 MED ORDER — OXYCODONE-ACETAMINOPHEN 5-325 MG PO TABS: 1.0000 | ORAL_TABLET | Freq: Four times a day (QID) | ORAL | 0 refills | Status: DC | PRN

## 2018-03-16 NOTE — ED Triage Notes (Signed)
Pt here for eval of right upper dental pain, swelling noted to her cheek. Pt states she has had this for several days.

## 2018-03-16 NOTE — ED Provider Notes (Signed)
[facility] EMERGENCY DEPARTMENT Provider Note   CSN: [id number] Arrival date & time: 03/16/18  9476     History   Chief Complaint Chief Complaint  Patient presents with  . Dental Pain    HPI Carol Nichols is a 28 y.o. female.  HPI   28 year old female with 1 week history of dental pain.  Patient notes swelling and pain to the right upper lateral gumline.  Patient notes a broken tooth below swelling.  She denies any fever, she notes minor pain at this time which is resolved with ibuprofen.  She denies any allergies.  She notes she has had this before and had to see a dental specialist for extraction of the tooth.  Past Medical History:  Diagnosis Date  . Anxiety   . Depression    hospitalized in August  . Mental disorder   . Pregnancy induced hypertension     Patient Active Problem List   Diagnosis Date Noted  . Acute on chronic diastolic heart failure (HCC)   . Acute pulmonary edema (HCC)   . Acute respiratory failure with hypoxia (HCC)   . Pregnancy-induced hypertension in third trimester   . Preeclampsia 02/21/2016  . Shortness of breath 02/21/2016  . Hypertensive crisis 02/21/2016  . Pregnancy 02/18/2016  . Gestational hypertension w/o significant proteinuria in 3rd trimester 02/17/2016  . Severe episode of recurrent major depressive disorder, without psychotic features (HCC)   . Major depression 06/22/2015    Past Surgical History:  Procedure Laterality Date  . MOUTH SURGERY    . TOOTH EXTRACTION       OB History    Gravida  3   Para  1   Term  1   Preterm      AB  2   Living  1     SAB  1   TAB  1   Ectopic      Multiple  0   Live Births  1            Home Medications    Prior to Admission medications   Medication Sig Start Date End Date Taking? Authorizing Provider  acetaminophen (TYLENOL) 500 MG tablet Take 500 mg by mouth every 6 (six) hours as needed for headache.    [provider]    amoxicillin-clavulanate (AUGMENTIN) 875-125 MG tablet Take 1 tablet by mouth every 12 (twelve) hours. 03/16/18   Katianne Barre, Tinnie Gens, PA-C  calcium carbonate (TUMS - DOSED IN MG ELEMENTAL CALCIUM) 500 MG chewable tablet Chew 2 tablets by mouth daily as needed for indigestion or heartburn.    [provider]  labetalol (NORMODYNE) 100 MG tablet Take 1 tablet (100 mg total) by mouth 2 (two) times daily. 02/23/16   Tobey Grim, NP  oxyCODONE-acetaminophen (PERCOCET/ROXICET) 5-325 MG tablet Take 1 tablet by mouth every 6 (six) hours as needed. 03/16/18   Eyvonne Mechanic, PA-C  Prenatal Vit-Fe Fumarate-FA (PRENATAL MULTIVITAMIN) TABS tablet Take 1 tablet by mouth daily at 12 noon. 02/24/16   Tobey Grim, NP    Family History Family History  Problem Relation Age of Onset  . Hypertension Mother     Social History Social History   Tobacco Use  . Smoking status: Former Smoker    Packs/day: 0.25    Types: Cigarettes    Last attempt to quit: 07/15/2014    Years since quitting: 3.6  . Smokeless tobacco: Never Used  Substance Use Topics  . Alcohol use: No  Alcohol/week: 12.0 standard drinks    Types: 2 Cans of beer, 10 Shots of liquor per week    Comment: socially  . Drug use: Yes    Frequency: 7.0 times per week    Types: Marijuana    Allergies   Dust mite extract and Pollen extract   Review of Systems Review of Systems  All other systems reviewed and are negative.   Physical Exam Updated Vital Signs BP (!) 133/95 (BP Location: Right Arm)   Pulse 73   Temp 98 F (36.7 C) (Oral)   Resp 18   SpO2 99%   Physical Exam Vitals signs and nursing note reviewed.  Constitutional:      Appearance: She is well-developed.  HENT:     Head: Normocephalic and atraumatic.     Comments: Swelling noted to the right upper lateral gumline-no overlying cellulitis to the face-numerous broken teeth and dental caries noted Eyes:     General: No scleral icterus.       Right  eye: No discharge.        Left eye: No discharge.     Conjunctiva/sclera: Conjunctivae normal.     Pupils: Pupils are equal, round, and reactive to light.  Neck:     Musculoskeletal: Normal Vora of motion.     Vascular: No JVD.     Trachea: No tracheal deviation.  Pulmonary:     Effort: Pulmonary effort is normal.     Breath sounds: No stridor.  Neurological:     Mental Status: She is alert and oriented to person, place, and time.     Coordination: Coordination normal.  Psychiatric:        Behavior: Behavior normal.        Thought Content: Thought content normal.        Judgment: Judgment normal.      ED Treatments / Results  Labs (all labs ordered are listed, but only abnormal results are displayed) Labs Reviewed - No data to display  EKG None  Radiology No results found.  Procedures Procedures (including critical care time)  INCISION AND DRAINAGE Performed by: Kelle DartingJeffrey Todd Samar Dass Consent: Verbal consent obtained. Risks and benefits: risks, benefits and alternatives were discussed Type: abscess  Body area: Right upper gumline  Anesthesia: local infiltration  Incision was made with a scalpel.  Local anesthetic: Bupivacaine 2 % with epinephrine  Anesthetic total: 3 ml  Complexity: Simple  Drainage: None  Drainage amount: 0    Patient tolerance: Patient tolerated the procedure well with no immediate complications.    Medications Ordered in ED Medications - No data to display   Initial Impression / Assessment and Plan / ED Course  I have reviewed the triage vital signs and the nursing notes.  Pertinent labs & imaging results that were available during my care of the patient were reviewed by me and considered in my medical decision making (see chart for details).     Labs:   Imaging:  Consults:  Therapeutics:  Discharge Meds: Augmentin  Assessment/Plan: Pt here with dental infection.  I&D with no purulent drainage.  Patient placed on  antibiotics encouraged follow-up with dental resources.  Strict return precautions given.  She verbalized understanding and agreement to today's plan.      Final Clinical Impressions(s) / ED Diagnoses   Final diagnoses:  Dental infection    ED Discharge Orders         Ordered    oxyCODONE-acetaminophen (PERCOCET/ROXICET) 5-325 MG tablet  Every 6 hours  PRN     03/16/18 1022    amoxicillin-clavulanate (AUGMENTIN) 875-125 MG tablet  Every 12 hours     03/16/18 1023           Eyvonne Mechanic, PA-C 03/16/18 1028    [address], DO 03/16/18 1456

## 2018-03-16 NOTE — ED Notes (Signed)
Declined W/C at D/C and was escorted to lobby by RN. 

## 2018-03-16 NOTE — Discharge Instructions (Addendum)
Please read attached information. If you experience any new or worsening signs or symptoms please return to the emergency room for evaluation. Please follow-up with your primary care provider or specialist as discussed. Please use medication prescribed only as directed and discontinue taking if you have any concerning signs or symptoms.   °

## 2019-12-04 ENCOUNTER — Other Ambulatory Visit: Payer: Self-pay | Admitting: Internal Medicine

## 2019-12-04 ENCOUNTER — Other Ambulatory Visit: Payer: Self-pay

## 2019-12-04 DIAGNOSIS — N63 Unspecified lump in unspecified breast: Secondary | ICD-10-CM

## 2019-12-10 ENCOUNTER — Other Ambulatory Visit: Payer: Self-pay | Admitting: Internal Medicine

## 2019-12-10 DIAGNOSIS — N63 Unspecified lump in unspecified breast: Secondary | ICD-10-CM

## 2020-01-09 ENCOUNTER — Other Ambulatory Visit: Payer: Self-pay

## 2020-03-05 ENCOUNTER — Other Ambulatory Visit: Payer: Self-pay

## 2020-10-27 ENCOUNTER — Emergency Department (HOSPITAL_COMMUNITY): Payer: Medicaid Other

## 2020-10-27 ENCOUNTER — Encounter (HOSPITAL_COMMUNITY): Payer: Self-pay | Admitting: *Deleted

## 2020-10-27 ENCOUNTER — Inpatient Hospital Stay (HOSPITAL_COMMUNITY): Payer: Medicaid Other

## 2020-10-27 ENCOUNTER — Encounter (HOSPITAL_COMMUNITY)
Admission: EM | Disposition: A | Discharge status: Home / Self Care | Payer: Self-pay | Source: Home / Self Care | Attending: Orthopedic Surgery

## 2020-10-27 ENCOUNTER — Emergency Department (HOSPITAL_COMMUNITY): Payer: Medicaid Other | Admitting: Certified Registered"

## 2020-10-27 ENCOUNTER — Inpatient Hospital Stay (HOSPITAL_COMMUNITY)
Admission: EM | Admit: 2020-10-27 | Discharge: 2020-10-28 | DRG: 493 | Disposition: A | Discharge status: Home / Self Care | Payer: Medicaid Other | Attending: Orthopedic Surgery | Admitting: Orthopedic Surgery

## 2020-10-27 ENCOUNTER — Other Ambulatory Visit: Payer: Self-pay

## 2020-10-27 DIAGNOSIS — S82231A Displaced oblique fracture of shaft of right tibia, initial encounter for closed fracture: Secondary | ICD-10-CM | POA: Diagnosis present

## 2020-10-27 DIAGNOSIS — S82201A Unspecified fracture of shaft of right tibia, initial encounter for closed fracture: Secondary | ICD-10-CM | POA: Diagnosis present

## 2020-10-27 DIAGNOSIS — E669 Obesity, unspecified: Secondary | ICD-10-CM | POA: Diagnosis present

## 2020-10-27 DIAGNOSIS — Z91048 Other nonmedicinal substance allergy status: Secondary | ICD-10-CM

## 2020-10-27 DIAGNOSIS — Y92410 Unspecified street and highway as the place of occurrence of the external cause: Secondary | ICD-10-CM | POA: Diagnosis not present

## 2020-10-27 DIAGNOSIS — Z79899 Other long term (current) drug therapy: Secondary | ICD-10-CM | POA: Diagnosis not present

## 2020-10-27 DIAGNOSIS — Z6841 Body Mass Index (BMI) 40.0 and over, adult: Secondary | ICD-10-CM

## 2020-10-27 DIAGNOSIS — Z419 Encounter for procedure for purposes other than remedying health state, unspecified: Secondary | ICD-10-CM

## 2020-10-27 DIAGNOSIS — Z20822 Contact with and (suspected) exposure to covid-19: Secondary | ICD-10-CM | POA: Diagnosis present

## 2020-10-27 DIAGNOSIS — Z87891 Personal history of nicotine dependence: Secondary | ICD-10-CM

## 2020-10-27 DIAGNOSIS — I10 Essential (primary) hypertension: Secondary | ICD-10-CM | POA: Diagnosis not present

## 2020-10-27 DIAGNOSIS — S82431A Displaced oblique fracture of shaft of right fibula, initial encounter for closed fracture: Secondary | ICD-10-CM | POA: Diagnosis present

## 2020-10-27 HISTORY — PX: TIBIA IM NAIL INSERTION: SHX2516

## 2020-10-27 LAB — CBC
HCT: 39.1 % (ref 36.0–46.0)
Hemoglobin: 12.9 g/dL (ref 12.0–15.0)
MCH: 29.1 pg (ref 26.0–34.0)
MCHC: 33 g/dL (ref 30.0–36.0)
MCV: 88.1 fL (ref 80.0–100.0)
Platelets: 285 10*3/uL (ref 150–400)
RBC: 4.44 MIL/uL (ref 3.87–5.11)
RDW: 12.9 % (ref 11.5–15.5)
WBC: 11 10*3/uL — ABNORMAL HIGH (ref 4.0–10.5)
nRBC: 0 % (ref 0.0–0.2)

## 2020-10-27 LAB — CBC WITH DIFFERENTIAL/PLATELET
Abs Immature Granulocytes: 0.07 10*3/uL (ref 0.00–0.07)
Basophils Absolute: 0 10*3/uL (ref 0.0–0.1)
Basophils Relative: 0 %
Eosinophils Absolute: 0 10*3/uL (ref 0.0–0.5)
Eosinophils Relative: 0 %
HCT: 41.1 % (ref 36.0–46.0)
Hemoglobin: 13.4 g/dL (ref 12.0–15.0)
Immature Granulocytes: 1 %
Lymphocytes Relative: 16 %
Lymphs Abs: 1.9 10*3/uL (ref 0.7–4.0)
MCH: 28.8 pg (ref 26.0–34.0)
MCHC: 32.6 g/dL (ref 30.0–36.0)
MCV: 88.4 fL (ref 80.0–100.0)
Monocytes Absolute: 0.9 10*3/uL (ref 0.1–1.0)
Monocytes Relative: 7 %
Neutro Abs: 8.9 10*3/uL — ABNORMAL HIGH (ref 1.7–7.7)
Neutrophils Relative %: 76 %
Platelets: 281 10*3/uL (ref 150–400)
RBC: 4.65 MIL/uL (ref 3.87–5.11)
RDW: 12.9 % (ref 11.5–15.5)
WBC: 11.7 10*3/uL — ABNORMAL HIGH (ref 4.0–10.5)
nRBC: 0 % (ref 0.0–0.2)

## 2020-10-27 LAB — POCT PREGNANCY, URINE: Preg Test, Ur: NEGATIVE

## 2020-10-27 LAB — BASIC METABOLIC PANEL
Anion gap: 9 (ref 5–15)
BUN: 9 mg/dL (ref 6–20)
CO2: 26 mmol/L (ref 22–32)
Calcium: 9.3 mg/dL (ref 8.9–10.3)
Chloride: 102 mmol/L (ref 98–111)
Creatinine, Ser: 0.86 mg/dL (ref 0.44–1.00)
GFR, Estimated: >60 mL/min (ref >60)
Glucose, Bld: 122 mg/dL — ABNORMAL HIGH (ref 70–99)
Potassium: 3.6 mmol/L (ref 3.5–5.1)
Sodium: 137 mmol/L (ref 135–145)

## 2020-10-27 LAB — RESP PANEL BY RT-PCR (FLU A&B, COVID) ARPGX2
Influenza A by PCR: NEGATIVE
Influenza B by PCR: NEGATIVE
SARS Coronavirus 2 by RT PCR: NEGATIVE

## 2020-10-27 LAB — CBG MONITORING, ED: Glucose-Capillary: 128 mg/dL — ABNORMAL HIGH (ref 70–99)

## 2020-10-27 LAB — CREATININE, SERUM
Creatinine, Ser: 0.81 mg/dL (ref 0.44–1.00)
GFR, Estimated: >60 mL/min (ref >60)

## 2020-10-27 LAB — SURGICAL PCR SCREEN
MRSA, PCR: NEGATIVE
Staphylococcus aureus: NEGATIVE

## 2020-10-27 SURGERY — INSERTION, INTRAMEDULLARY ROD, TIBIA
Anesthesia: General | Site: Leg Lower | Laterality: Right

## 2020-10-27 MED ORDER — CHLORHEXIDINE GLUCONATE 4 % EX LIQD
60.0000 mL | Freq: Once | CUTANEOUS | Status: DC
  Filled 2020-10-27: qty 60

## 2020-10-27 MED ORDER — DOCUSATE SODIUM 100 MG PO CAPS
100.0000 mg | ORAL_CAPSULE | Freq: Two times a day (BID) | ORAL | Status: DC
  Administered 2020-10-27 – 2020-10-28 (×2): 100 mg via ORAL
  Filled 2020-10-27 (×2): qty 1

## 2020-10-27 MED ORDER — CEFAZOLIN SODIUM-DEXTROSE 2-4 GM/100ML-% IV SOLN
2.0000 g | Freq: Four times a day (QID) | INTRAVENOUS | Status: AC
Start: 2020-10-27 — End: 2020-10-28
  Administered 2020-10-27 – 2020-10-28 (×3): 2 g via INTRAVENOUS
  Filled 2020-10-27 (×4): qty 100

## 2020-10-27 MED ORDER — HYDROMORPHONE HCL 1 MG/ML IJ SOLN
1.0000 mg | Freq: Once | INTRAMUSCULAR | Status: AC
  Administered 2020-10-27: 1 mg via INTRAVENOUS
  Filled 2020-10-27: qty 1

## 2020-10-27 MED ORDER — ACETAMINOPHEN 500 MG PO TABS: 1000.0000 mg | ORAL_TABLET | Freq: Once | ORAL | Status: AC

## 2020-10-27 MED ORDER — ACETAMINOPHEN 325 MG PO TABS: 325.0000 mg | ORAL_TABLET | Freq: Four times a day (QID) | ORAL | Status: DC | PRN

## 2020-10-27 MED ORDER — PROPOFOL 10 MG/ML IV BOLUS
INTRAVENOUS | Status: AC
  Filled 2020-10-27: qty 20

## 2020-10-27 MED ORDER — LIDOCAINE 2% (20 MG/ML) 5 ML SYRINGE
INTRAMUSCULAR | Status: DC | PRN
  Administered 2020-10-27: 80 mg via INTRAVENOUS

## 2020-10-27 MED ORDER — METHOCARBAMOL 1000 MG/10ML IJ SOLN
500.0000 mg | Freq: Four times a day (QID) | INTRAVENOUS | Status: DC | PRN
  Filled 2020-10-27: qty 5

## 2020-10-27 MED ORDER — CHLORHEXIDINE GLUCONATE 0.12 % MT SOLN
OROMUCOSAL | Status: AC
  Administered 2020-10-27: 15 mL
  Filled 2020-10-27: qty 15

## 2020-10-27 MED ORDER — HYDROMORPHONE HCL 1 MG/ML IJ SOLN
0.2500 mg | INTRAMUSCULAR | Status: DC | PRN
  Administered 2020-10-27: 0.5 mg via INTRAVENOUS

## 2020-10-27 MED ORDER — LACTATED RINGERS IV SOLN: INTRAVENOUS | Status: DC | PRN

## 2020-10-27 MED ORDER — POLYETHYLENE GLYCOL 3350 17 G PO PACK: 17.0000 g | PACK | Freq: Every day | ORAL | Status: DC | PRN

## 2020-10-27 MED ORDER — CEFAZOLIN SODIUM-DEXTROSE 2-4 GM/100ML-% IV SOLN
2.0000 g | INTRAVENOUS | Status: AC
  Administered 2020-10-27: 2 g via INTRAVENOUS

## 2020-10-27 MED ORDER — ROCURONIUM BROMIDE 10 MG/ML (PF) SYRINGE
PREFILLED_SYRINGE | INTRAVENOUS | Status: AC
  Filled 2020-10-27: qty 20

## 2020-10-27 MED ORDER — ONDANSETRON HCL 4 MG/2ML IJ SOLN: 4.0000 mg | Freq: Four times a day (QID) | INTRAMUSCULAR | Status: DC | PRN

## 2020-10-27 MED ORDER — LISINOPRIL 10 MG PO TABS
10.0000 mg | ORAL_TABLET | Freq: Every day | ORAL | Status: DC
  Administered 2020-10-28: 10 mg via ORAL
  Filled 2020-10-27: qty 1

## 2020-10-27 MED ORDER — LACTATED RINGERS IV SOLN: INTRAVENOUS | Status: DC

## 2020-10-27 MED ORDER — METHOCARBAMOL 500 MG PO TABS
500.0000 mg | ORAL_TABLET | Freq: Four times a day (QID) | ORAL | Status: DC | PRN
  Administered 2020-10-28: 500 mg via ORAL
  Filled 2020-10-27: qty 1

## 2020-10-27 MED ORDER — DROPERIDOL 2.5 MG/ML IJ SOLN: 0.6250 mg | Freq: Once | INTRAMUSCULAR | Status: DC | PRN

## 2020-10-27 MED ORDER — ACETAMINOPHEN 500 MG PO TABS
ORAL_TABLET | ORAL | Status: AC
  Administered 2020-10-27: 1000 mg via ORAL
  Filled 2020-10-27: qty 2

## 2020-10-27 MED ORDER — MIDAZOLAM HCL 5 MG/5ML IJ SOLN
INTRAMUSCULAR | Status: DC | PRN
  Administered 2020-10-27: 2 mg via INTRAVENOUS

## 2020-10-27 MED ORDER — ESCITALOPRAM OXALATE 10 MG PO TABS
10.0000 mg | ORAL_TABLET | Freq: Every day | ORAL | Status: DC
  Filled 2020-10-27 (×2): qty 1

## 2020-10-27 MED ORDER — ENOXAPARIN SODIUM 40 MG/0.4ML IJ SOSY
40.0000 mg | PREFILLED_SYRINGE | INTRAMUSCULAR | Status: DC
  Administered 2020-10-28: 40 mg via SUBCUTANEOUS
  Filled 2020-10-27: qty 0.4

## 2020-10-27 MED ORDER — HYDROMORPHONE HCL 1 MG/ML IJ SOLN
INTRAMUSCULAR | Status: AC
  Filled 2020-10-27: qty 1

## 2020-10-27 MED ORDER — 0.9 % SODIUM CHLORIDE (POUR BTL) OPTIME
TOPICAL | Status: DC | PRN
  Administered 2020-10-27: 1000 mL

## 2020-10-27 MED ORDER — SUGAMMADEX SODIUM 200 MG/2ML IV SOLN
INTRAVENOUS | Status: DC | PRN
  Administered 2020-10-27: 250 mg via INTRAVENOUS

## 2020-10-27 MED ORDER — ONDANSETRON HCL 4 MG/2ML IJ SOLN
INTRAMUSCULAR | Status: DC | PRN
  Administered 2020-10-27: 4 mg via INTRAVENOUS

## 2020-10-27 MED ORDER — FENTANYL CITRATE (PF) 100 MCG/2ML IJ SOLN
INTRAMUSCULAR | Status: AC
  Filled 2020-10-27: qty 2

## 2020-10-27 MED ORDER — LISINOPRIL-HYDROCHLOROTHIAZIDE 10-12.5 MG PO TABS: 1.0000 | ORAL_TABLET | Freq: Every day | ORAL | Status: DC

## 2020-10-27 MED ORDER — HYDROCHLOROTHIAZIDE 12.5 MG PO CAPS
12.5000 mg | ORAL_CAPSULE | Freq: Every day | ORAL | Status: DC
  Administered 2020-10-28: 12.5 mg via ORAL
  Filled 2020-10-27: qty 1

## 2020-10-27 MED ORDER — SCOPOLAMINE 1 MG/3DAYS TD PT72
MEDICATED_PATCH | TRANSDERMAL | Status: AC
  Administered 2020-10-27: 1.5 mg via TRANSDERMAL
  Filled 2020-10-27: qty 1

## 2020-10-27 MED ORDER — POVIDONE-IODINE 10 % EX SWAB
2.0000 "application " | Freq: Once | CUTANEOUS | Status: AC
  Administered 2020-10-27: 2 via TOPICAL

## 2020-10-27 MED ORDER — ZOLPIDEM TARTRATE 5 MG PO TABS: 5.0000 mg | ORAL_TABLET | Freq: Every evening | ORAL | Status: DC | PRN

## 2020-10-27 MED ORDER — PROPOFOL 10 MG/ML IV BOLUS
INTRAVENOUS | Status: DC | PRN
  Administered 2020-10-27: 150 mg via INTRAVENOUS

## 2020-10-27 MED ORDER — HYDROMORPHONE HCL 1 MG/ML IJ SOLN
INTRAMUSCULAR | Status: AC
  Administered 2020-10-27: 0.5 mg via INTRAVENOUS
  Filled 2020-10-27: qty 2

## 2020-10-27 MED ORDER — OXYCODONE HCL 5 MG PO TABS
10.0000 mg | ORAL_TABLET | ORAL | Status: DC | PRN
  Administered 2020-10-27: 10 mg via ORAL
  Filled 2020-10-27: qty 2

## 2020-10-27 MED ORDER — SUCCINYLCHOLINE CHLORIDE 200 MG/10ML IV SOSY
PREFILLED_SYRINGE | INTRAVENOUS | Status: DC | PRN
  Administered 2020-10-27: 160 mg via INTRAVENOUS

## 2020-10-27 MED ORDER — DEXAMETHASONE SODIUM PHOSPHATE 10 MG/ML IJ SOLN
INTRAMUSCULAR | Status: DC | PRN
  Administered 2020-10-27: 10 mg via INTRAVENOUS

## 2020-10-27 MED ORDER — CEFAZOLIN SODIUM-DEXTROSE 2-4 GM/100ML-% IV SOLN
INTRAVENOUS | Status: AC
  Filled 2020-10-27: qty 100

## 2020-10-27 MED ORDER — FENTANYL CITRATE (PF) 250 MCG/5ML IJ SOLN
INTRAMUSCULAR | Status: DC | PRN
  Administered 2020-10-27: 100 ug via INTRAVENOUS
  Administered 2020-10-27: 50 ug via INTRAVENOUS

## 2020-10-27 MED ORDER — ACETAMINOPHEN 500 MG PO TABS
1000.0000 mg | ORAL_TABLET | Freq: Four times a day (QID) | ORAL | Status: AC
  Administered 2020-10-27 – 2020-10-28 (×4): 1000 mg via ORAL
  Filled 2020-10-27 (×4): qty 2

## 2020-10-27 MED ORDER — HYDROMORPHONE HCL 1 MG/ML IJ SOLN
1.0000 mg | INTRAMUSCULAR | Status: DC | PRN
  Administered 2020-10-27: 1 mg via INTRAVENOUS
  Filled 2020-10-27: qty 1

## 2020-10-27 MED ORDER — FENTANYL CITRATE (PF) 100 MCG/2ML IJ SOLN
50.0000 ug | Freq: Once | INTRAMUSCULAR | Status: AC
  Administered 2020-10-27: 50 ug via INTRAVENOUS

## 2020-10-27 MED ORDER — SCOPOLAMINE 1 MG/3DAYS TD PT72
1.0000 | MEDICATED_PATCH | TRANSDERMAL | Status: DC
  Filled 2020-10-27: qty 1

## 2020-10-27 MED ORDER — HYDROMORPHONE HCL 1 MG/ML IJ SOLN: 0.5000 mg | INTRAMUSCULAR | Status: DC | PRN

## 2020-10-27 MED ORDER — OXYCODONE HCL 5 MG PO TABS
5.0000 mg | ORAL_TABLET | ORAL | Status: DC | PRN
  Administered 2020-10-28: 10 mg via ORAL
  Filled 2020-10-27: qty 2

## 2020-10-27 MED ORDER — ROCURONIUM BROMIDE 10 MG/ML (PF) SYRINGE
PREFILLED_SYRINGE | INTRAVENOUS | Status: DC | PRN
  Administered 2020-10-27 (×2): 10 mg via INTRAVENOUS
  Administered 2020-10-27: 40 mg via INTRAVENOUS

## 2020-10-27 MED ORDER — ONDANSETRON HCL 4 MG PO TABS: 4.0000 mg | ORAL_TABLET | Freq: Four times a day (QID) | ORAL | Status: DC | PRN

## 2020-10-27 MED ORDER — FENTANYL CITRATE (PF) 250 MCG/5ML IJ SOLN
INTRAMUSCULAR | Status: AC
  Filled 2020-10-27: qty 5

## 2020-10-27 MED ORDER — MIDAZOLAM HCL 2 MG/2ML IJ SOLN
INTRAMUSCULAR | Status: AC
  Filled 2020-10-27: qty 2

## 2020-10-27 MED ORDER — HYDROMORPHONE HCL 1 MG/ML IJ SOLN
1.0000 mg | Freq: Once | INTRAMUSCULAR | Status: AC
  Administered 2020-10-27: 1 mg via INTRAVENOUS

## 2020-10-27 MED ORDER — PROMETHAZINE HCL 25 MG/ML IJ SOLN: 6.2500 mg | INTRAMUSCULAR | Status: DC | PRN

## 2020-10-27 SURGICAL SUPPLY — 68 items
ADH SKN CLS APL DERMABOND .7 (GAUZE/BANDAGES/DRESSINGS) ×1
APL PRP STRL LF DISP 70% ISPRP (MISCELLANEOUS) ×1
BAG COUNTER SPONGE SURGICOUNT (BAG) ×2 IMPLANT
BAG SPNG CNTER NS LX DISP (BAG) ×1
BIT DRILL FREE HAND 4.2X130 (DRILL) IMPLANT
BIT DRILL LOCK 4.2X360 (DRILL) IMPLANT
BNDG CMPR MED 10X6 ELC LF (GAUZE/BANDAGES/DRESSINGS) ×1
BNDG COHESIVE 4X5 TAN STRL (GAUZE/BANDAGES/DRESSINGS) ×2 IMPLANT
BNDG ELASTIC 6X10 VLCR STRL LF (GAUZE/BANDAGES/DRESSINGS) ×1 IMPLANT
BNDG GAUZE ELAST 4 BULKY (GAUZE/BANDAGES/DRESSINGS) ×2 IMPLANT
CHLORAPREP W/TINT 26 (MISCELLANEOUS) ×2 IMPLANT
COVER SURGICAL LIGHT HANDLE (MISCELLANEOUS) ×4 IMPLANT
DERMABOND ADVANCED (GAUZE/BANDAGES/DRESSINGS) ×1
DERMABOND ADVANCED .7 DNX12 (GAUZE/BANDAGES/DRESSINGS) IMPLANT
DRAPE C-ARM 42X72 X-RAY (DRAPES) ×2 IMPLANT
DRAPE C-ARMOR (DRAPES) IMPLANT
DRAPE ORTHO SPLIT 77X108 STRL (DRAPES) ×4
DRAPE SURG ORHT 6 SPLT 77X108 (DRAPES) IMPLANT
DRILL FREE HAND 4.2X130 (DRILL) ×2
DRILL LOCK 4.2X360 (DRILL) ×2
DRSG ADAPTIC 3X8 NADH LF (GAUZE/BANDAGES/DRESSINGS) ×2 IMPLANT
ELECT CAUTERY BLADE 6.4 (BLADE) ×2 IMPLANT
ELECT REM PT RETURN 9FT ADLT (ELECTROSURGICAL) ×2
ELECTRODE REM PT RTRN 9FT ADLT (ELECTROSURGICAL) ×1 IMPLANT
GAUZE SPONGE 4X4 12PLY STRL (GAUZE/BANDAGES/DRESSINGS) ×2 IMPLANT
GLOVE SRG 8 PF TXTR STRL LF DI (GLOVE) ×1 IMPLANT
GLOVE SURG ENC MOIS LTX SZ7.5 (GLOVE) ×2 IMPLANT
GLOVE SURG SYN 7.5  E (GLOVE) ×2
GLOVE SURG SYN 7.5 E (GLOVE) ×1 IMPLANT
GLOVE SURG SYN 7.5 PF PI (GLOVE) ×1 IMPLANT
GLOVE SURG UNDER POLY LF SZ7.5 (GLOVE) ×2 IMPLANT
GLOVE SURG UNDER POLY LF SZ8 (GLOVE) ×2
GOWN STRL REUS W/ TWL XL LVL3 (GOWN DISPOSABLE) ×2 IMPLANT
GOWN STRL REUS W/TWL XL LVL3 (GOWN DISPOSABLE) ×4
GUIDEROD T2 3X1000 (ROD) ×1 IMPLANT
GUIDEWIRE GAMMA (WIRE) ×1 IMPLANT
K-WIRE FIXATION 3X285 COATED (WIRE) ×4
KIT BASIN OR (CUSTOM PROCEDURE TRAY) ×2 IMPLANT
KIT TURNOVER KIT B (KITS) ×2 IMPLANT
KWIRE FIXATION 3X285 COATED (WIRE) IMPLANT
MANIFOLD NEPTUNE II (INSTRUMENTS) ×2 IMPLANT
NAIL ELAS INSERT SLV SPI 8-11 (MISCELLANEOUS) ×1 IMPLANT
NAIL TIB T2 10X345 (Nail) ×1 IMPLANT
NS IRRIG 1000ML POUR BTL (IV SOLUTION) ×2 IMPLANT
PACK ORTHO EXTREMITY (CUSTOM PROCEDURE TRAY) ×2 IMPLANT
PAD ARMBOARD 7.5X6 YLW CONV (MISCELLANEOUS) ×3 IMPLANT
PAD CAST 4YDX4 CTTN HI CHSV (CAST SUPPLIES) ×1 IMPLANT
PADDING CAST COTTON 4X4 STRL (CAST SUPPLIES) ×2
PADDING CAST COTTON 6X4 STRL (CAST SUPPLIES) ×1 IMPLANT
REAMER INTRAMEDULLARY 8MM 510 (MISCELLANEOUS) ×1 IMPLANT
SCREW LOCK T2 5X37.5 (Screw) ×1 IMPLANT
SCREW LOCK T2 5X40 (Screw) ×1 IMPLANT
SCREW LOCK T2 5X45 (Screw) ×1 IMPLANT
SCREW LOCK T2 5X55 (Screw) ×2 IMPLANT
SPONGE T-LAP 18X18 ~~LOC~~+RFID (SPONGE) ×2 IMPLANT
STRIP CLOSURE SKIN 1/2X4 (GAUZE/BANDAGES/DRESSINGS) ×1 IMPLANT
SUT ETHILON 3 0 PS 1 (SUTURE) ×1 IMPLANT
SUT MNCRL AB 3-0 PS2 18 (SUTURE) ×1 IMPLANT
SUT VIC AB 1 CT1 27 (SUTURE) ×2
SUT VIC AB 1 CT1 27XBRD ANBCTR (SUTURE) IMPLANT
SUT VIC AB 2-0 CT1 (SUTURE) ×1 IMPLANT
SYR BULB IRRIG 60ML STRL (SYRINGE) ×2 IMPLANT
TOWEL GREEN STERILE (TOWEL DISPOSABLE) ×2 IMPLANT
TOWEL GREEN STERILE FF (TOWEL DISPOSABLE) ×2 IMPLANT
TOWEL OR NON WOVEN STRL DISP B (DISPOSABLE) ×2 IMPLANT
TUBE CONNECTING 12X1/4 (SUCTIONS) ×2 IMPLANT
WATER STERILE IRR 1000ML POUR (IV SOLUTION) ×2 IMPLANT
YANKAUER SUCT BULB TIP NO VENT (SUCTIONS) ×2 IMPLANT

## 2020-10-27 NOTE — Anesthesia Procedure Notes (Signed)
Procedure Name: Intubation Date/Time: 10/27/2020 2:21 PM Performed by: Lanell Matar, CRNA Pre-anesthesia Checklist: Patient identified, Emergency Drugs available, Suction available and Patient being monitored Patient Re-evaluated:Patient Re-evaluated prior to induction Oxygen Delivery Method: Circle System Utilized Preoxygenation: Pre-oxygenation with 100% oxygen Induction Type: IV induction Laryngoscope Size: Miller and 2 Grade View: Grade I Tube type: Oral Tube size: 7.0 mm Number of attempts: 1 Airway Equipment and Method: Stylet and Oral airway Placement Confirmation: ETT inserted through vocal cords under direct vision, positive ETCO2 and breath sounds checked- equal and bilateral Secured at: 22 cm Tube secured with: Tape Dental Injury: Teeth and Oropharynx as per pre-operative assessment

## 2020-10-27 NOTE — Progress Notes (Signed)
Orthopedic Tech Progress Note Patient Details:  Carol Nichols 12-08-1990 [id number]  Ortho Devices Type of Ortho Device: Short leg splint, Stirrup splint Ortho Device/Splint Location: RLE Ortho Device/Splint Interventions: Ordered, Application, Adjustment   Post Interventions Patient Tolerated: Fair, Well Instructions Provided: Care of device  Donald Pore 10/27/2020, 8:09 AM
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
## 2020-10-27 NOTE — Transfer of Care (Signed)
Immediate Anesthesia Transfer of Care Note  Patient: Carol Nichols  Procedure(s) Performed: INTRAMEDULLARY (IM) NAIL TIBIAL (Right: Leg Lower)  Patient Location: PACU  Anesthesia Type:General  Level of Consciousness: drowsy and patient cooperative  Airway & Oxygen Therapy: Patient Spontanous Breathing  Post-op Assessment: Report given to RN and Post -op Vital signs reviewed and stable  Post vital signs: Reviewed and stable  Last Vitals:  Vitals Value Taken Time  BP 124/68 10/27/20 1609  Temp 36.1 C 10/27/20 1609  Pulse 86 10/27/20 1612  Resp 18 10/27/20 1612  SpO2 90 % 10/27/20 1612  Vitals shown include unvalidated device data.  Last Pain:  Vitals:   10/27/20 1226  TempSrc:   PainSc: 9          Complications: No notable events documented.

## 2020-10-27 NOTE — Anesthesia Preprocedure Evaluation (Addendum)
Anesthesia Evaluation  Patient identified by MRN, date of birth, ID band Patient awake    Reviewed: Allergy & Precautions, NPO status , Patient's Chart, lab work & pertinent test results  Airway Mallampati: III  TM Distance: >3 FB Neck ROM: Full    Dental no notable dental hx.    Pulmonary former smoker,    Pulmonary exam normal breath sounds clear to auscultation       Cardiovascular Exercise Tolerance: Good hypertension, Normal cardiovascular exam Rhythm:Regular Rate:Normal     Neuro/Psych PSYCHIATRIC DISORDERS Anxiety Depression    GI/Hepatic negative GI ROS, Neg liver ROS,   Endo/Other  Morbid obesity  Renal/GU negative Renal ROS  negative genitourinary   Musculoskeletal negative musculoskeletal ROS (+)   Abdominal   Peds negative pediatric ROS (+)  Hematology   Anesthesia Other Findings   Reproductive/Obstetrics negative OB ROS                            Anesthesia Physical Anesthesia Plan  ASA: 3  Anesthesia Plan: General   Post-op Pain Management:    Induction: Intravenous  PONV Risk Score and Plan: 3 and Treatment may vary due to age or medical condition, Midazolam, Scopolamine patch - Pre-op and Ondansetron  Airway Management Planned:   Additional Equipment: None  Intra-op Plan:   Post-operative Plan: Extubation in OR  Informed Consent: I have reviewed the patients History and Physical, chart, labs and discussed the procedure including the risks, benefits and alternatives for the proposed anesthesia with the patient or authorized representative who has indicated his/her understanding and acceptance.     Dental advisory given  Plan Discussed with: CRNA and Anesthesiologist  Anesthesia Plan Comments:         Anesthesia Quick Evaluation

## 2020-10-27 NOTE — Progress Notes (Signed)
Pt having pain in right leg; MD Donavan Foil at bedside and verbal ordered Fentanyl IV.

## 2020-10-27 NOTE — ED Notes (Signed)
Patient stated last intake was yesterday around 5PM,

## 2020-10-27 NOTE — Progress Notes (Addendum)
     Subjective:  Patient lying comfortably in bed this morning. Pain is well controlled.. Denies distal n/t. Has not yet worked with PT. Reviewed her images from surgery with her  this morning. All questions were answered.  Objective:   VITALS:   Vitals:   10/27/20 1939 10/27/20 2357 10/28/20 0311 10/28/20 0801  BP: (!) 149/95 (!) 127/94 120/70 126/77  Pulse: 84 74 69 66  Resp: 18 17 18 16   Temp: 98.4 F (36.9 C) 98.7 F (37.1 C) 98.5 F (36.9 C) 98.4 F (36.9 C)  TempSrc: Oral Oral Oral Oral  SpO2: 100% 99% 99% 98%  Weight:      Height:        Neurologically intact Neurovascular intact Sensation intact distally Intact pulses distally Dorsiflexion/Plantar flexion intact Compartment soft Dressing c/d/i  Lab Results  Component Value Date   WBC 11.9 (H) 10/28/2020   HGB 12.0 10/28/2020   HCT 36.1 10/28/2020   MCV 87.6 10/28/2020   PLT 284 10/28/2020   BMET    Component Value Date/Time   NA 135 10/28/2020 0331   K 3.9 10/28/2020 0331   CL 97 (L) 10/28/2020 0331   CO2 28 10/28/2020 0331   GLUCOSE 121 (H) 10/28/2020 0331   BUN 7 10/28/2020 0331   CREATININE 0.73 10/28/2020 0331   CALCIUM 9.4 10/28/2020 0331   GFRNONAA >60 10/28/2020 0331   GFRAA >60 02/23/2016 0334     Assessment/Plan: 1 Day Post-Op   Assessment:  Right tibial shaft fracture s/p IMN 9/7 HTN - resumed home medication Obesity    Plan: -WBAT RLE  - PT/OT - abx: post op ancef x23 hours - lovenox starting POD1 x4 weeks for DVT prophylaxis - Dry dressing. Change as needed if soiled or saturated otherwise can remain intact. - f/u in clinic in 2 weeks for wound check and suture removal  - discharge pending PT/OT clearance and pain control   Chantry Headen A Gaines Cartmell 10/28/2020, 9:35 AM   12/28/2020, MD Cell [phone]
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
## 2020-10-28 ENCOUNTER — Encounter (HOSPITAL_COMMUNITY): Payer: Self-pay | Admitting: Orthopedic Surgery

## 2020-10-28 LAB — CBC
HCT: 36.1 % (ref 36.0–46.0)
Hemoglobin: 12 g/dL (ref 12.0–15.0)
MCH: 29.1 pg (ref 26.0–34.0)
MCHC: 33.2 g/dL (ref 30.0–36.0)
MCV: 87.6 fL (ref 80.0–100.0)
Platelets: 284 10*3/uL (ref 150–400)
RBC: 4.12 MIL/uL (ref 3.87–5.11)
RDW: 12.7 % (ref 11.5–15.5)
WBC: 11.9 10*3/uL — ABNORMAL HIGH (ref 4.0–10.5)
nRBC: 0 % (ref 0.0–0.2)

## 2020-10-28 LAB — BASIC METABOLIC PANEL
Anion gap: 10 (ref 5–15)
BUN: 7 mg/dL (ref 6–20)
CO2: 28 mmol/L (ref 22–32)
Calcium: 9.4 mg/dL (ref 8.9–10.3)
Chloride: 97 mmol/L — ABNORMAL LOW (ref 98–111)
Creatinine, Ser: 0.73 mg/dL (ref 0.44–1.00)
GFR, Estimated: >60 mL/min (ref >60)
Glucose, Bld: 121 mg/dL — ABNORMAL HIGH (ref 70–99)
Potassium: 3.9 mmol/L (ref 3.5–5.1)
Sodium: 135 mmol/L (ref 135–145)

## 2020-10-28 MED ORDER — OXYCODONE HCL 5 MG PO TABS: 5.0000 mg | ORAL_TABLET | ORAL | 0 refills | Status: DC | PRN

## 2020-10-28 MED ORDER — ENOXAPARIN SODIUM 40 MG/0.4ML IJ SOSY: 40.0000 mg | PREFILLED_SYRINGE | INTRAMUSCULAR | 0 refills | Status: DC

## 2020-10-28 MED ORDER — OXYCODONE HCL 5 MG PO TABS: 5.0000 mg | ORAL_TABLET | ORAL | 0 refills | Status: AC | PRN
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
## 2020-10-28 NOTE — Progress Notes (Signed)
Jakhia Shouse to be D/C'd  per MD order.  Discussed with the patient and all questions fully answered.  VSS, Skin clean, dry and intact without evidence of skin break down, no evidence of skin tears noted.  IV catheter discontinued intact. Site without signs and symptoms of complications. Dressing and pressure applied.  An After Visit Summary was printed and given to the patient. Patient received prescription. Patient also took 3 in 1 and walker.  Patient instructed to return to ED, call 911, or call MD for any changes in condition.   Patient to be escorted via WC, and D/C home via private auto.

## 2020-10-28 NOTE — TOC CAGE-AID Note (Signed)
Transition of Care [facility]) - CAGE-AID Screening   Patient Details  Name: Glayds Insco MRN: [id number] Date of Birth: 1990-12-30  Transition of Care [facility]) CM/SW Contact:    Erin Sons, LCSW Phone Number: 10/28/2020, 10:41 AM   Clinical Narrative:  CAGE-AID Completed; score of 0. Pt reports she does not drink alcohol or use any other substances.   CAGE-AID Screening:    Have You Ever Felt You Ought to Cut Down on Your Drinking or Drug Use?: No Have People Annoyed You By Critizing Your Drinking Or Drug Use?: No Have You Felt Bad Or Guilty About Your Drinking Or Drug Use?: No Have You Ever Had a Drink or Used Drugs First Thing In The Morning to Steady Your Nerves or to Get Rid of a Hangover?: No CAGE-AID Score: 0  Substance Abuse Education Offered: No
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
## 2020-10-28 NOTE — Anesthesia Postprocedure Evaluation (Signed)
Anesthesia Post Note  Patient: Carol Nichols  Procedure(s) Performed: INTRAMEDULLARY (IM) NAIL TIBIAL (Right: Leg Lower)     Patient location during evaluation: PACU Anesthesia Type: General Level of consciousness: awake and alert Pain management: pain level controlled Vital Signs Assessment: post-procedure vital signs reviewed and stable Respiratory status: spontaneous breathing, nonlabored ventilation, respiratory function stable and patient connected to nasal cannula oxygen Cardiovascular status: blood pressure returned to baseline and stable Postop Assessment: no apparent nausea or vomiting Anesthetic complications: no   No notable events documented.  Last Vitals:  Vitals:   10/28/20 0311 10/28/20 0801  BP: 120/70 126/77  Pulse: 69 66  Resp: 18 16  Temp: 36.9 C 36.9 C  SpO2: 99% 98%    Last Pain:  Vitals:   10/28/20 0836  TempSrc:   PainSc: 6                  Candra R Alisyn Lequire

## 2020-10-28 NOTE — Care Management (Signed)
Ordered walker and 3 in1 with Velna Hatchet with [employer].
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
## 2020-10-29 ENCOUNTER — Encounter (HOSPITAL_COMMUNITY): Payer: Self-pay | Admitting: Orthopedic Surgery

## 2020-11-04 NOTE — Addendum Note (Signed)
Addendum  created 11/04/20 1235 by Mellody Dance, MD   Intraprocedure Staff edited

## 2020-11-07 ENCOUNTER — Telehealth (HOSPITAL_COMMUNITY): Payer: Self-pay | Admitting: Orthopedic Surgery

## 2020-11-07 MED ORDER — OXYCODONE HCL 5 MG PO TABS: 5.0000 mg | ORAL_TABLET | Freq: Three times a day (TID) | ORAL | 0 refills | Status: AC | PRN
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
## 2020-12-27 ENCOUNTER — Encounter (HOSPITAL_COMMUNITY): Payer: Self-pay

## 2020-12-27 ENCOUNTER — Other Ambulatory Visit: Payer: Self-pay

## 2020-12-27 ENCOUNTER — Emergency Department (HOSPITAL_COMMUNITY)
Admission: EM | Admit: 2020-12-27 | Discharge: 2020-12-27 | Disposition: A | Discharge status: Home / Self Care | Payer: Medicaid Other | Attending: Emergency Medicine | Admitting: Emergency Medicine

## 2020-12-27 DIAGNOSIS — Z87891 Personal history of nicotine dependence: Secondary | ICD-10-CM | POA: Diagnosis not present

## 2020-12-27 DIAGNOSIS — K0889 Other specified disorders of teeth and supporting structures: Secondary | ICD-10-CM | POA: Diagnosis present

## 2020-12-27 MED ORDER — PENICILLIN V POTASSIUM 500 MG PO TABS: 500.0000 mg | ORAL_TABLET | Freq: Four times a day (QID) | ORAL | 0 refills | Status: AC

## 2020-12-27 NOTE — ED Triage Notes (Signed)
Pt reports having dental pain since last night to her right upper wisdom tooth.

## 2020-12-27 NOTE — ED Provider Notes (Signed)
[facility] DEPT Provider Note   CSN: [id number] Arrival date & time: 12/27/20  0340     History Chief Complaint  Patient presents with   Dental Pain    Carol Nichols is a 30 y.o. female.  HPI  Patient with no significant medical history presents to the emergency department with chief complaint of right upper dental pain.  Patient states pain started yesterday, came on suddenly, states she has a dull-like sensation in her right upper molar, does not radiate, denies  alleviating or aggravating factors.  She denies tongue, throat, lip swelling, difficulty breathing, denies  fevers or chills, chest pain, shortness of breath, worsening peripheral edema.  She denies  trauma to the area, states that she thinks she just has a dental cavity, states she is go to follow-up with a dentist.  Past Medical History:  Diagnosis Date   Anxiety    Depression    hospitalized in August   Mental disorder    Pregnancy induced hypertension     Patient Active Problem List   Diagnosis Date Noted   Fracture of tibial shaft, right, closed 10/27/2020   Acute on chronic diastolic heart failure ([place])    Acute pulmonary edema (H[facility])    Acute respiratory failure with hypoxia ([place])    Pregnancy-induced hypertension in third trimester    Preeclampsia 02/21/2016   Shortness of breath 02/21/2016   Hypertensive crisis 02/21/2016   Pregnancy 02/18/2016   Gestational hypertension w/o significant proteinuria in 3rd trimester 02/17/2016   Severe episode of recurrent major depressive disorder, without psychotic features ([place])    Major depression 06/22/2015    Past Surgical History:  Procedure Laterality Date   MOUTH SURGERY     TIBIA IM NAIL INSERTION Right 10/27/2020   Procedure: INTRAMEDULLARY (IM) NAIL TIBIAL;  Surgeon: Willaim Sheng, MD;  Location: [facility];  Service: Orthopedics;  Laterality: Right;   TOOTH EXTRACTION       OB History     Gravida  3   Para  1    Term  1   Preterm      AB  2   Living  1      SAB  1   IAB  1   Ectopic      Multiple  0   Live Births  1           Family History  Problem Relation Age of Onset   Hypertension Mother     Social History   Tobacco Use   Smoking status: Former    Packs/day: 0.25    Types: Cigarettes    Quit date: 07/15/2014    Years since quitting: 6.4   Smokeless tobacco: Never  Vaping Use   Vaping Use: Some days  Substance Use Topics   Alcohol use: No    Alcohol/week: 12.0 standard drinks    Types: 2 Cans of beer, 10 Shots of liquor per week    Comment: socially   Drug use: Yes    Frequency: 7.0 times per week    Types: Marijuana    Home Medications Prior to Admission medications   Medication Sig Start Date End Date Taking? Authorizing Provider  penicillin v potassium (VEETID) 500 MG tablet Take 1 tablet (500 mg total) by mouth 4 (four) times daily for 7 days. 12/27/20 01/03/21 Yes Marcello Fennel, PA-C  acetaminophen (TYLENOL) 500 MG tablet Take 500 mg by mouth every 6 (six) hours as needed for headache.  [provider]  enoxaparin (LOVENOX) 40 MG/0.4ML injection Inject 0.4 mLs (40 mg total) into the skin daily for 28 days. 10/28/20 11/25/20  Lisette Abu, PA-C  escitalopram (LEXAPRO) 10 MG tablet Take 10 mg by mouth daily. 10/17/20   [provider]  lisinopril-hydrochlorothiazide (ZESTORETIC) 10-12.5 MG tablet Take 1 tablet by mouth daily. 10/17/20   [provider]    Allergies    Dust mite extract and Pollen extract  Review of Systems   Review of Systems  Constitutional:  Negative for chills and fever.  HENT:  Positive for dental problem. Negative for congestion, drooling, facial swelling, sore throat and trouble swallowing.   Respiratory:  Negative for shortness of breath.   Cardiovascular:  Negative for chest pain.  Gastrointestinal:  Negative for abdominal pain.  Genitourinary:  Negative for enuresis.  Musculoskeletal:   Negative for back pain.  Skin:  Negative for rash.  Neurological:  Negative for dizziness.  Hematological:  Does not bruise/bleed easily.   Physical Exam Updated Vital Signs BP (!) 174/108 (BP Location: Right Arm)   Pulse 84   Temp 98.7 F (37.1 C) (Oral)   Resp 15   Ht 5\' 5"  (1.651 m)   Wt 111.1 kg   SpO2 98%   BMI 40.77 kg/m   Physical Exam Vitals and nursing note reviewed.  Constitutional:      General: She is not in acute distress.    Appearance: She is not ill-appearing.  HENT:     Head: Normocephalic and atraumatic.     Nose: No congestion.     Mouth/Throat:     Mouth: Mucous membranes are moist.     Pharynx: Oropharynx is clear. No oropharyngeal exudate or posterior oropharyngeal erythema.     Comments: Oropharynx is visualized tongue and uvula are both midline, controlling oral secretions, tonsils were equal and symmetrical bilaterally, no tongue elevation present.  Patient has poor dental hygiene, she has noted tooth decay in the right upper molar, no erythema or edema around the gumline, gumline's are palpated no fluctuant indurations present. Eyes:     Conjunctiva/sclera: Conjunctivae normal.  Cardiovascular:     Rate and Rhythm: Normal rate and regular rhythm.     Pulses: Normal pulses.     Heart sounds: No murmur heard.   No friction rub. No gallop.  Pulmonary:     Effort: No respiratory distress.     Breath sounds: No wheezing, rhonchi or rales.  Musculoskeletal:     Right lower leg: No edema.     Left lower leg: No edema.  Skin:    General: Skin is warm and dry.  Neurological:     Mental Status: She is alert.  Psychiatric:        Mood and Affect: Mood normal.    ED Results / Procedures / Treatments   Labs (all labs ordered are listed, but only abnormal results are displayed) Labs Reviewed - No data to display  EKG None  Radiology No results found.  Procedures Procedures   Medications Ordered in ED Medications - No data to display  ED  Course  I have reviewed the triage vital signs and the nursing notes.  Pertinent labs & imaging results that were available during my care of the patient were reviewed by me and considered in my medical decision making (see chart for details).    MDM Rules/Calculators/A&P  Initial impression-presents with dental pain.  She is alert, does not appear acute stress, vital signs are reassuring.  Work-up-due to well-appearing patient, benign physical exam, further lab or imaging not warranted at this time.  Rule out- I have low suspicion for peritonsillar abscess, retropharyngeal abscess, or Ludwig angina as oropharynx was visualized tongue and uvula were both midline, there is no exudates, erythema or edema noted in the posterior pillars or on/ around tonsils.  Low suspicion for an abscess as gumline were palpated no fluctuance or induration felt.  Low suspicion for periorbital or orbital cellulitis as patient face had no erythematous, patient EOMs were intact, he had no pain with eye movement.    Plan-  Dental pain-likely patient has a dental cavity, will start her on antibiotics, recommend over-the-counter pain medication, follow-up with the dentist for further evaluation.  Vital signs have remained stable, no indication for hospital admission.   Patient given at home care as well strict return precautions.  Patient verbalized that they understood agreed to said plan.  Final Clinical Impression(s) / ED Diagnoses Final diagnoses:  Pain, dental    Rx / DC Orders ED Discharge Orders          Ordered    penicillin v potassium (VEETID) 500 MG tablet  4 times daily        12/27/20 0504             Carroll Sage, PA-C 12/27/20 0505    Nira Conn, MD 12/27/20 [phone]

## 2020-12-27 NOTE — Discharge Instructions (Signed)
You have a dental cavity, starting you on antibiotics please take as prescribed.  Recommend over-the-counter pain medication as needed.  Please follow-up with a dentist for further evaluation.  Come back to the emergency department if you develop chest pain, shortness of breath, severe abdominal pain, uncontrolled nausea, vomiting, diarrhea.
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
## 2023-03-08 ENCOUNTER — Encounter (HOSPITAL_COMMUNITY): Payer: Self-pay

## 2023-03-08 ENCOUNTER — Ambulatory Visit (HOSPITAL_COMMUNITY)
Admission: EM | Admit: 2023-03-08 | Discharge: 2023-03-08 | Disposition: A | Discharge status: Home / Self Care | Payer: Self-pay | Attending: Family Medicine | Admitting: Family Medicine

## 2023-03-08 DIAGNOSIS — K089 Disorder of teeth and supporting structures, unspecified: Secondary | ICD-10-CM

## 2023-03-08 DIAGNOSIS — G8929 Other chronic pain: Secondary | ICD-10-CM

## 2023-03-08 DIAGNOSIS — I10 Essential (primary) hypertension: Secondary | ICD-10-CM

## 2023-03-08 MED ORDER — AMOXICILLIN-POT CLAVULANATE 875-125 MG PO TABS: 1.0000 | ORAL_TABLET | Freq: Two times a day (BID) | ORAL | 0 refills | Status: AC

## 2023-03-08 MED ORDER — LISINOPRIL-HYDROCHLOROTHIAZIDE 10-12.5 MG PO TABS: 1.0000 | ORAL_TABLET | Freq: Every day | ORAL | 0 refills | Status: AC

## 2023-03-08 NOTE — ED Provider Notes (Signed)
[facility]    CSN: [id number] Arrival date & time: 03/08/23  1020      History   Chief Complaint Chief Complaint  Patient presents with   Dental Pain   Hypertension    HPI Carol Nichols is a 33 y.o. female.    Dental Pain Hypertension  Patient is here for dental pain for several months.  She is taking motrin for pain.  She was seen in November, took amoxil with help.  She did make an apt with a dentist, but had to cancel this.  Her bp is elevated,. She has been  out of her bp medication for some time.   She was lisinopril/hydrochlorothiazide in the past.  No chest pain, sob, or dizziness.  She is without insurance currently, but hopes for that to be active in several weeks.  She does not have a pcp.        Past Medical History:  Diagnosis Date   Anxiety    Depression    hospitalized in August   Mental disorder    Pregnancy induced hypertension     Patient Active Problem List   Diagnosis Date Noted   Fracture of tibial shaft, right, closed 10/27/2020   Acute on chronic diastolic heart failure ([facility])    Acute pulmonary edema ([facility])    Acute respiratory failure with hypoxia ([facility])    Pregnancy-induced hypertension in third trimester    Preeclampsia 02/21/2016   Shortness of breath 02/21/2016   Hypertensive crisis 02/21/2016   Pregnancy 02/18/2016   Gestational hypertension w/o significant proteinuria in 3rd trimester 02/17/2016   Severe episode of recurrent major depressive disorder, without psychotic features ([facility])    Major depression 06/22/2015    Past Surgical History:  Procedure Laterality Date   MOUTH SURGERY     TIBIA IM NAIL INSERTION Right 10/27/2020   Procedure: INTRAMEDULLARY (IM) NAIL TIBIAL;  Surgeon: Joen Laura, MD;  Location: [facility];  Service: Orthopedics;  Laterality: Right;   TOOTH EXTRACTION      OB History     Gravida  3   Para  1   Term  1   Preterm      AB  2   Living  1      SAB  1   IAB  1    Ectopic      Multiple  0   Live Births  1            Home Medications    Prior to Admission medications   Medication Sig Start Date End Date Taking? Authorizing Provider  acetaminophen (TYLENOL) 500 MG tablet Take 500 mg by mouth every 6 (six) hours as needed for headache.    [provider]  escitalopram (LEXAPRO) 10 MG tablet Take 10 mg by mouth daily. 10/17/20   [provider]  lisinopril-hydrochlorothiazide (ZESTORETIC) 10-12.5 MG tablet Take 1 tablet by mouth daily. 10/17/20   [provider]    Family History Family History  Problem Relation Age of Onset   Hypertension Mother     Social History Social History   Tobacco Use   Smoking status: Former    Current packs/day: 0.00    Types: Cigarettes    Quit date: 07/15/2014    Years since quitting: 8.6   Smokeless tobacco: Never  Vaping Use   Vaping status: Former  Substance Use Topics   Alcohol use: No    Alcohol/week: 12.0 standard drinks of alcohol    Types: 2  Cans of beer, 10 Shots of liquor per week    Comment: socially   Drug use: Not Currently    Frequency: 7.0 times per week    Types: Marijuana     Allergies   Dust mite extract and Pollen extract   Review of Systems Review of Systems  Constitutional: Negative.   HENT:  Positive for dental problem.   Respiratory: Negative.    Cardiovascular: Negative.   Gastrointestinal: Negative.   Genitourinary: Negative.   Musculoskeletal: Negative.      Physical Exam Triage Vital Signs ED Triage Vitals  Encounter Vitals Group     BP 03/08/23 1122 (!) 182/122     Systolic BP Percentile --      Diastolic BP Percentile --      Pulse Rate 03/08/23 1122 67     Resp 03/08/23 1122 16     Temp 03/08/23 1122 98.7 F (37.1 C)     Temp Source 03/08/23 1122 Oral     SpO2 03/08/23 1122 99 %     Weight --      Height 03/08/23 1112 5\' 5"  (1.651 m)     Head Circumference --      Peak Flow --      Pain Score --      Pain Loc --       Pain Education --      Exclude from Growth Chart --    No data found.  Updated Vital Signs BP (!) 182/122 (BP Location: Right Arm)   Pulse 67   Temp 98.7 F (37.1 C) (Oral)   Resp 16   Ht 5\' 5"  (1.651 m)   LMP 03/01/2023 (Approximate)   SpO2 99%   Breastfeeding No   BMI 40.77 kg/m   Visual Acuity Right Eye Distance:   Left Eye Distance:   Bilateral Distance:    Right Eye Near:   Left Eye Near:    Bilateral Near:     Physical Exam Constitutional:      General: She is not in acute distress.    Appearance: Normal appearance. She is normal weight. She is not ill-appearing or toxic-appearing.  HENT:     Head:     Comments: Missing several teeth to the top and bottom molars;  no obvious swelling or abscess noted;     Nose: Nose normal.  Cardiovascular:     Rate and Rhythm: Normal rate and regular rhythm.  Pulmonary:     Effort: Pulmonary effort is normal.     Breath sounds: Normal breath sounds.  Skin:    General: Skin is warm.  Neurological:     General: No focal deficit present.     Mental Status: She is alert.  Psychiatric:        Mood and Affect: Mood normal.      UC Treatments / Results  Labs (all labs ordered are listed, but only abnormal results are displayed) Labs Reviewed - No data to display  EKG   Radiology No results found.  Procedures Procedures (including critical care time)  Medications Ordered in UC Medications - No data to display  Initial Impression / Assessment and Plan / UC Course  I have reviewed the triage vital signs and the nursing notes.  Pertinent labs & imaging results that were available during my care of the patient were reviewed by me and considered in my medical decision making (see chart for details).   Final Clinical Impressions(s) / UC Diagnoses   Final diagnoses:  Chronic dental pain  Essential hypertension     Discharge Instructions      You were seen today for dental pain and hypertension.  I  have sent out an antibiotic to take twice/day x 10 days. I have also refilled your blood pressure medication.  Please avoid using motrin/advil for pain as this may contribute to elevated blood pressure.  You may take tylenol for pain.  Please follow up with a primary care provider by going to [email or web address].  Follow up with a dentist when able as well.     ED Prescriptions     Medication Sig Dispense Auth. Provider   amoxicillin-clavulanate (AUGMENTIN) 875-125 MG tablet Take 1 tablet by mouth every 12 (twelve) hours. 14 tablet Jolanda Mccann, MD   lisinopril-hydrochlorothiazide (ZESTORETIC) 10-12.5 MG tablet Take 1 tablet by mouth daily. 90 tablet Jannifer Franklin, MD      PDMP not reviewed this encounter.   Jannifer Franklin, MD 03/08/23 [phone]

## 2023-03-08 NOTE — Discharge Instructions (Signed)
You were seen today for dental pain and hypertension.  I have sent out an antibiotic to take twice/day x 10 days. I have also refilled your blood pressure medication.  Please avoid using motrin/advil for pain as this may contribute to elevated blood pressure.  You may take tylenol for pain.  Please follow up with a primary care provider by going to [email or web address].  Follow up with a dentist when able as well.

## 2023-03-08 NOTE — ED Triage Notes (Signed)
Patient here today with c/o bilat dental pain for a couple months now.   Patient is also requesting a refill on her blood pressure medication. She states that she doesn't have insurance and ran out of her medication.   Patient also mentions that she tried to get in to see a dentist but he wants her to get her blood pressure under control before he does any procedures.
